# Patient Record
Sex: Male | Born: 1945 | Race: Black or African American | Hispanic: No | Smoking: Former smoker
Health system: Southern US, Community
[De-identification: ages and names within clinical notes are randomized; demographics above are authoritative.]

## PROBLEM LIST (undated history)

## (undated) DIAGNOSIS — R1310 Dysphagia, unspecified: Secondary | ICD-10-CM

## (undated) DIAGNOSIS — N289 Disorder of kidney and ureter, unspecified: Secondary | ICD-10-CM

## (undated) DIAGNOSIS — G8194 Hemiplegia, unspecified affecting left nondominant side: Secondary | ICD-10-CM

## (undated) DIAGNOSIS — G8190 Hemiplegia, unspecified affecting unspecified side: Secondary | ICD-10-CM

## (undated) DIAGNOSIS — I10 Essential (primary) hypertension: Secondary | ICD-10-CM

## (undated) DIAGNOSIS — I639 Cerebral infarction, unspecified: Secondary | ICD-10-CM

## (undated) DIAGNOSIS — F4320 Adjustment disorder, unspecified: Secondary | ICD-10-CM

## (undated) DIAGNOSIS — R410 Disorientation, unspecified: Secondary | ICD-10-CM

## (undated) HISTORY — DX: Cerebral infarction, unspecified: I63.9

## (undated) HISTORY — PX: GANGLION CYST EXCISION: SHX1691

## (undated) HISTORY — DX: Essential (primary) hypertension: I10

---

## 2004-07-14 ENCOUNTER — Ambulatory Visit: Payer: Self-pay | Admitting: Family Medicine

## 2004-07-29 ENCOUNTER — Ambulatory Visit: Payer: Self-pay | Admitting: Family Medicine

## 2004-08-01 ENCOUNTER — Ambulatory Visit: Payer: Self-pay | Admitting: Sports Medicine

## 2004-09-03 ENCOUNTER — Ambulatory Visit: Payer: Self-pay | Admitting: Family Medicine

## 2004-09-16 ENCOUNTER — Encounter: Admission: RE | Admit: 2004-09-16 | Discharge: 2004-09-16 | Payer: Self-pay | Admitting: Sports Medicine

## 2004-09-17 ENCOUNTER — Ambulatory Visit: Payer: Self-pay | Admitting: Family Medicine

## 2004-10-08 ENCOUNTER — Ambulatory Visit: Payer: Self-pay | Admitting: Sports Medicine

## 2004-11-12 ENCOUNTER — Ambulatory Visit: Payer: Self-pay | Admitting: Family Medicine

## 2004-12-11 ENCOUNTER — Ambulatory Visit: Payer: Self-pay | Admitting: Family Medicine

## 2004-12-19 ENCOUNTER — Ambulatory Visit: Payer: Self-pay | Admitting: Family Medicine

## 2004-12-26 ENCOUNTER — Ambulatory Visit: Payer: Self-pay | Admitting: Family Medicine

## 2005-01-12 ENCOUNTER — Ambulatory Visit: Payer: Self-pay | Admitting: Family Medicine

## 2005-01-19 ENCOUNTER — Ambulatory Visit: Payer: Self-pay | Admitting: Family Medicine

## 2005-02-11 ENCOUNTER — Ambulatory Visit: Payer: Self-pay | Admitting: Family Medicine

## 2005-02-26 ENCOUNTER — Ambulatory Visit: Payer: Self-pay | Admitting: Family Medicine

## 2005-03-10 ENCOUNTER — Ambulatory Visit: Payer: Self-pay | Admitting: Family Medicine

## 2005-03-31 ENCOUNTER — Ambulatory Visit: Payer: Self-pay | Admitting: Family Medicine

## 2005-05-05 ENCOUNTER — Ambulatory Visit: Payer: Self-pay | Admitting: Family Medicine

## 2005-06-17 ENCOUNTER — Ambulatory Visit: Payer: Self-pay | Admitting: Family Medicine

## 2005-08-24 ENCOUNTER — Ambulatory Visit: Payer: Self-pay | Admitting: Family Medicine

## 2005-08-28 ENCOUNTER — Ambulatory Visit: Payer: Self-pay | Admitting: Family Medicine

## 2006-12-09 DIAGNOSIS — F5232 Male orgasmic disorder: Secondary | ICD-10-CM

## 2006-12-09 DIAGNOSIS — E7800 Pure hypercholesterolemia, unspecified: Secondary | ICD-10-CM | POA: Insufficient documentation

## 2007-04-18 DIAGNOSIS — I69959 Hemiplegia and hemiparesis following unspecified cerebrovascular disease affecting unspecified side: Secondary | ICD-10-CM

## 2007-04-20 ENCOUNTER — Inpatient Hospital Stay (HOSPITAL_COMMUNITY): Admission: EM | Admit: 2007-04-20 | Discharge: 2007-04-26 | Payer: Self-pay | Admitting: Emergency Medicine

## 2007-04-21 ENCOUNTER — Encounter (INDEPENDENT_AMBULATORY_CARE_PROVIDER_SITE_OTHER): Payer: Self-pay | Admitting: Neurology

## 2007-04-21 ENCOUNTER — Ambulatory Visit: Payer: Self-pay | Admitting: Vascular Surgery

## 2007-04-22 ENCOUNTER — Ambulatory Visit: Payer: Self-pay | Admitting: Physical Medicine & Rehabilitation

## 2007-04-26 ENCOUNTER — Inpatient Hospital Stay (HOSPITAL_COMMUNITY)
Admission: RE | Admit: 2007-04-26 | Discharge: 2007-05-25 | Payer: Self-pay | Admitting: Physical Medicine & Rehabilitation

## 2007-06-22 ENCOUNTER — Encounter
Admission: RE | Admit: 2007-06-22 | Discharge: 2007-06-23 | Payer: Self-pay | Admitting: Physical Medicine & Rehabilitation

## 2007-06-22 ENCOUNTER — Ambulatory Visit: Payer: Self-pay | Admitting: Physical Medicine & Rehabilitation

## 2007-12-13 ENCOUNTER — Encounter
Admission: RE | Admit: 2007-12-13 | Discharge: 2007-12-14 | Payer: Self-pay | Admitting: Physical Medicine & Rehabilitation

## 2007-12-13 ENCOUNTER — Ambulatory Visit: Payer: Self-pay | Admitting: Physical Medicine & Rehabilitation

## 2008-04-24 ENCOUNTER — Encounter: Admission: RE | Admit: 2008-04-24 | Discharge: 2008-05-15 | Payer: Self-pay | Admitting: Internal Medicine

## 2008-07-10 ENCOUNTER — Emergency Department (HOSPITAL_COMMUNITY): Admission: EM | Admit: 2008-07-10 | Discharge: 2008-07-10 | Payer: Self-pay | Admitting: Emergency Medicine

## 2009-07-12 ENCOUNTER — Ambulatory Visit: Payer: Self-pay | Admitting: Vascular Surgery

## 2009-07-12 ENCOUNTER — Encounter: Payer: Self-pay | Admitting: Infectious Diseases

## 2009-07-12 ENCOUNTER — Ambulatory Visit: Payer: Self-pay | Admitting: Internal Medicine

## 2009-07-12 ENCOUNTER — Ambulatory Visit: Payer: Self-pay | Admitting: Infectious Diseases

## 2009-07-12 ENCOUNTER — Inpatient Hospital Stay (HOSPITAL_COMMUNITY): Admission: EM | Admit: 2009-07-12 | Discharge: 2009-07-13 | Payer: Self-pay | Admitting: Emergency Medicine

## 2009-07-12 LAB — CONVERTED CEMR LAB: Hgb A1c MFr Bld: 6.5 %

## 2009-07-13 ENCOUNTER — Encounter: Payer: Self-pay | Admitting: Internal Medicine

## 2009-08-08 ENCOUNTER — Encounter: Payer: Self-pay | Admitting: Internal Medicine

## 2009-08-08 ENCOUNTER — Ambulatory Visit: Payer: Self-pay | Admitting: Internal Medicine

## 2009-08-15 LAB — CONVERTED CEMR LAB
BUN: 17 mg/dL (ref 6–23)
Chloride: 107 meq/L (ref 96–112)
Creatinine, Ser: 0.9 mg/dL (ref 0.40–1.50)
Glucose, Bld: 137 mg/dL — ABNORMAL HIGH (ref 70–99)

## 2009-08-22 ENCOUNTER — Encounter: Payer: Self-pay | Admitting: Internal Medicine

## 2009-09-10 ENCOUNTER — Encounter: Admission: RE | Admit: 2009-09-10 | Discharge: 2009-10-11 | Payer: Self-pay | Admitting: Internal Medicine

## 2009-09-11 ENCOUNTER — Ambulatory Visit: Payer: Self-pay | Admitting: Internal Medicine

## 2009-09-11 DIAGNOSIS — I10 Essential (primary) hypertension: Secondary | ICD-10-CM | POA: Insufficient documentation

## 2009-09-11 DIAGNOSIS — E119 Type 2 diabetes mellitus without complications: Secondary | ICD-10-CM

## 2009-09-11 LAB — CONVERTED CEMR LAB

## 2009-10-01 ENCOUNTER — Ambulatory Visit: Payer: Self-pay | Admitting: Internal Medicine

## 2009-10-01 LAB — CONVERTED CEMR LAB
AST: 16 units/L (ref 0–37)
Bilirubin, Direct: 0.1 mg/dL (ref 0.0–0.3)
LDL Cholesterol: 65 mg/dL (ref 0–99)
Total CHOL/HDL Ratio: 3.3
Total Protein: 7.4 g/dL (ref 6.0–8.3)
Triglycerides: 93 mg/dL (ref <150)

## 2009-10-13 ENCOUNTER — Encounter: Payer: Self-pay | Admitting: Internal Medicine

## 2009-10-15 ENCOUNTER — Encounter: Admission: RE | Admit: 2009-10-15 | Discharge: 2009-11-22 | Payer: Self-pay | Admitting: Internal Medicine

## 2009-10-29 ENCOUNTER — Ambulatory Visit: Payer: Self-pay | Admitting: Internal Medicine

## 2009-11-06 ENCOUNTER — Encounter: Payer: Self-pay | Admitting: Internal Medicine

## 2009-11-22 ENCOUNTER — Encounter: Payer: Self-pay | Admitting: Internal Medicine

## 2009-12-10 ENCOUNTER — Ambulatory Visit: Payer: Self-pay | Admitting: Internal Medicine

## 2009-12-10 LAB — CONVERTED CEMR LAB
Blood Glucose, Fingerstick: 121
Calcium: 9.8 mg/dL (ref 8.4–10.5)
Chloride: 104 meq/L (ref 96–112)
Glucose, Bld: 128 mg/dL — ABNORMAL HIGH (ref 70–99)
Potassium: 4.1 meq/L (ref 3.5–5.3)
Total Bilirubin: 0.3 mg/dL (ref 0.3–1.2)

## 2009-12-18 ENCOUNTER — Encounter: Payer: Self-pay | Admitting: Internal Medicine

## 2010-03-24 ENCOUNTER — Ambulatory Visit: Payer: Self-pay | Admitting: Internal Medicine

## 2010-03-24 DIAGNOSIS — K5730 Diverticulosis of large intestine without perforation or abscess without bleeding: Secondary | ICD-10-CM | POA: Insufficient documentation

## 2010-03-24 LAB — CONVERTED CEMR LAB
Creatinine, Ser: 0.77 mg/dL (ref 0.40–1.50)
Glucose, Bld: 95 mg/dL (ref 70–99)
Hgb A1c MFr Bld: 6.2 %

## 2010-06-13 ENCOUNTER — Telehealth: Payer: Self-pay | Admitting: Internal Medicine

## 2010-09-22 ENCOUNTER — Ambulatory Visit: Payer: Self-pay | Admitting: Internal Medicine

## 2010-09-22 DIAGNOSIS — F17200 Nicotine dependence, unspecified, uncomplicated: Secondary | ICD-10-CM

## 2010-09-22 LAB — CONVERTED CEMR LAB
AST: 16 units/L (ref 0–37)
Alkaline Phosphatase: 87 units/L (ref 39–117)
BUN: 17 mg/dL (ref 6–23)
Blood Glucose, Fingerstick: 99
CO2: 24 meq/L (ref 19–32)
Chloride: 101 meq/L (ref 96–112)
Creatinine, Ser: 0.63 mg/dL (ref 0.40–1.50)
Glucose, Bld: 98 mg/dL (ref 70–99)
HDL: 34 mg/dL — ABNORMAL LOW (ref >39)
Microalb, Ur: 1.45 mg/dL (ref 0.00–1.89)
Potassium: 4 meq/L (ref 3.5–5.3)
Total Bilirubin: 0.2 mg/dL — ABNORMAL LOW (ref 0.3–1.2)
Total Protein: 7.1 g/dL (ref 6.0–8.3)
Triglycerides: 113 mg/dL (ref <150)

## 2010-09-23 ENCOUNTER — Telehealth: Payer: Self-pay | Admitting: Internal Medicine

## 2010-09-25 ENCOUNTER — Telehealth (INDEPENDENT_AMBULATORY_CARE_PROVIDER_SITE_OTHER): Payer: Self-pay | Admitting: *Deleted

## 2010-11-12 NOTE — Progress Notes (Signed)
Summary: Refill/gh  Phone Note Refill Request Message from:  Fax from Pharmacy on June 13, 2010 2:36 PM  Refills Requested: Medication #1:  PRAVACHOL 40 MG TABS Take 1 tablet by mouth once a day   Dosage confirmed as above?Dosage Confirmed   Brand Name Necessary? No   Supply Requested: 1 month   Last Refilled: 05/12/2010  Method Requested: Electronic Initial call taken by: Angelina Ok RN,  June 13, 2010 2:37 PM  Follow-up for Phone Call        Refill approved-nurse to complete Follow-up by: Lars Mage MD,  June 14, 2010 11:43 AM    Prescriptions: PRAVACHOL 40 MG TABS (PRAVASTATIN SODIUM) Take 1 tablet by mouth once a day  #30 x 11   Entered and Authorized by:   Lars Mage MD   Signed by:   Lars Mage MD on 06/14/2010   Method used:   Electronically to        [facility] (retail)       [address]       [place], [place]  [address]       Ph: [phone]       Fax: [phone]   RxID:   [id number]

## 2010-11-12 NOTE — Assessment & Plan Note (Signed)
Summary: F/U APPT/CFB   Vital Signs:  Patient profile:   65 year old male Height:      72 inches (182.88 cm) Weight:      207.3 pounds (94.23 kg) BMI:     28.22 Temp:     97.3 degrees F (36.28 degrees C) oral Pulse rate:   90 / minute BP sitting:   144 / 76  (right arm)  Vitals Entered By: Stanton Kidney Ditzler RN (December 10, 2009 9:21 AM) Is Patient Diabetic? Yes Did you bring your meter with you today? No Pain Assessment Patient in pain? no      Nutritional Status BMI of 25 - 29 = overweight Nutritional Status Detail appetite good CBG Result 121  Have you ever been in a relationship where you felt threatened, hurt or afraid?denies   Does patient need assistance? Functional Status Self care Ambulation Impaired:Risk for fall, Wheelchair Comments Uses a cane. FU - doing well. Colonscopy sch next week.   Primary Care Provider:  Lars Mage MD   History of Present Illness: Pt is a 65 year old man with pmh of Ischemic stroke in 2008 and HTN comes in today for a follow up visit. He was admitted on Oct 1st for an increasing left sided weakness but the workup done for any new stroke was negative.  He does not have any new complaints today. He is getting his PT regularly and has been complaint with all his meds. He does not want to quit smoking at this time.  Depression History:      The patient denies a depressed mood most of the day and a diminished interest in his usual daily activities.         Preventive Screening-Counseling & Management  Alcohol-Tobacco     Alcohol drinks/day: 0     Smoking Status: current     Smoking Cessation Counseling: yes     Smoke Cessation Stage: precontemplative     Packs/Day: 2 cigs per day     Pack years: 35  Caffeine-Diet-Exercise     Does Patient Exercise: no  Problems Prior to Update: 1)  Preventive Health Care  (ICD-V70.0) 2)  Hypertension  (ICD-401.9) 3)  Diabetes Mellitus, Type II  (ICD-250.00) 4)  Cva With Left Hemiparesis   (ICD-438.20) 5)  Hypercholesterolemia  (ICD-272.0) 6)  Erectile Dysfunction  (ICD-302.74) 7)  Diabetes Mellitus II, Uncomplicated  (ICD-250.00)  Medications Prior to Update: 1)  Metformin Hcl 500 Mg Tabs (Metformin Hcl) .... Take 1 Tablet By Mouth Two Times A Day 2)  Lisinopril-Hydrochlorothiazide 20-25 Mg Tabs (Lisinopril-Hydrochlorothiazide) .... Take 1 Tablet By Mouth Once A Day 3)  Cvs Omeprazole 20 Mg Tbec (Omeprazole) .... Take 1 Tablet By Mouth Once A Day 4)  Pravachol 40 Mg Tabs (Pravastatin Sodium) .... Take 1 Tablet By Mouth Once A Day 5)  Aggrenox 25-200 Mg Xr12h-Cap (Aspirin-Dipyridamole) .... Take 1 Capsule By Mouth Two Times A Day  Current Medications (verified): 1)  Metformin Hcl 500 Mg Tabs (Metformin Hcl) .... Take 1 Tablet By Mouth Two Times A Day 2)  Lisinopril-Hydrochlorothiazide 20-25 Mg Tabs (Lisinopril-Hydrochlorothiazide) .... Take 1 Tablet By Mouth Once A Day 3)  Cvs Omeprazole 20 Mg Tbec (Omeprazole) .... Take 1 Tablet By Mouth Once A Day 4)  Pravachol 40 Mg Tabs (Pravastatin Sodium) .... Take 1 Tablet By Mouth Once A Day 5)  Aggrenox 25-200 Mg Xr12h-Cap (Aspirin-Dipyridamole) .... Take 1 Capsule By Mouth Two Times A Day  Allergies (verified): No Known Drug Allergies  Past History:  Past Medical History: Last updated: 09/11/2009 Diabetes mellitus, type II Cerebrovascular accident, hx of Hypertension  Past Surgical History: Last updated: 12-14-06 S/P ganglion cyst removal-wrist in 1965 - 07/12/2004  Family History: Last updated: 2006-12-14 Father died at 26 with DM., Mother alive at 63 with Breast Cancer.  Social History: Last updated: 08/08/2009 Evacuee from [place].  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  quit 2 years ago. Does not drink Etoh.  Eats a fairly healthy diet. Not working now and has applied for disablility.  Risk Factors: Alcohol Use: 0 (12/10/2009) Exercise: no (12/10/2009)  Risk Factors: Smoking Status:  current (12/10/2009) Packs/Day: 2 cigs per day (12/10/2009)  Social History: Packs/Day:  2 cigs per day  Review of Systems      See HPI  Physical Exam  Additional Exam:  Gen: AOx3, in no acute distress Eyes: PERRL, EOMI ENT:MMM, No erythema noted in posterior pharynx Neck: No JVD, No LAP Chest: CTAB with  good respiratory effort CVS: regular rhythmic rate, NO M/R/G, S1 S2 normal Abdo: soft,ND, BS+x4, Non tender and No hepatosplenomegaly EXT: No odema noted Neuro: left hemiparesis Skin: no rashes noted.    Impression & Recommendations:  Problem # 1:  HYPERTENSION (ICD-401.9) His blood pressure is doing much better today as compared to last visist but is still slightly upp from goal. With continued PT and not much benefit by reducing it from 144 to 140 in terms of 10 year risk reduction, I will not start him on another med at this time. His updated medication list for this problem includes:    Lisinopril-hydrochlorothiazide 20-25 Mg Tabs (Lisinopril-hydrochlorothiazide) .Marland Kitchen... Take 1 tablet by mouth once a day  Orders: T-Comprehensive Metabolic Panel ([id number])  BP today: 144/76 Prior BP: 189/90 (10/29/2009)  Labs Reviewed: K+: 4.1 (08/08/2009) Creat: : 0.90 (08/08/2009)   Chol: 121 (10/01/2009)   HDL: 37 (10/01/2009)   LDL: 65 (10/01/2009)   TG: 93 (10/01/2009)  Problem # 2:  PREVENTIVE HEALTH CARE (ICD-V70.0) Colonoscopy next week. Rest of the vaccines are uptodate. Reviewed preventive care protocols, scheduled due services, and updated immunizations.  Problem # 3:  DIABETES MELLITUS, TYPE II (ICD-250.00) well controlled with latest HBa1c of 6.1. He is unable to give his urine sample for Urine microalbumin ratio. His updated medication list for this problem includes:    Metformin Hcl 500 Mg Tabs (Metformin hcl) .Marland Kitchen... Take 1 tablet by mouth two times a day    Lisinopril-hydrochlorothiazide 20-25 Mg Tabs (Lisinopril-hydrochlorothiazide) .Marland Kitchen... Take 1 tablet by mouth  once a day  Orders: Capillary Blood Glucose/CBG (21308) T-Comprehensive Metabolic Panel ([id number])  Problem # 4:  CVA WITH LEFT HEMIPARESIS (ICD-438.20) He recently completed his PT and has promises to continue working on his arm as suggested by PT. His updated medication list for this problem includes:    Aggrenox 25-200 Mg Xr12h-cap (Aspirin-dipyridamole) .Marland Kitchen... Take 1 capsule by mouth two times a day  Problem # 5:  HYPERCHOLESTEROLEMIA (ICD-272.0) Will obatin CMP for hepatic function in view of chronic use of statins. His updated medication list for this problem includes:    Pravachol 40 Mg Tabs (Pravastatin sodium) .Marland Kitchen... Take 1 tablet by mouth once a day  Orders: T-Comprehensive Metabolic Panel ([id number])  Complete Medication List: 1)  Metformin Hcl 500 Mg Tabs (Metformin hcl) .... Take 1 tablet by mouth two times a day 2)  Lisinopril-hydrochlorothiazide 20-25 Mg Tabs (Lisinopril-hydrochlorothiazide) .... Take 1 tablet by mouth once a day 3)  Cvs Omeprazole 20  Mg Tbec (Omeprazole) .... Take 1 tablet by mouth once a day 4)  Pravachol 40 Mg Tabs (Pravastatin sodium) .... Take 1 tablet by mouth once a day 5)  Aggrenox 25-200 Mg Xr12h-cap (Aspirin-dipyridamole) .... Take 1 capsule by mouth two times a day  Patient Instructions: 1)  Please schedule a follow-up appointment in 6 months. 2)  Please schedule a follow-up appointment as needed. 3)  Limit your Sodium (Salt). 4)  Limit your Sodium (Salt) to less than 2 grams a day(slightly less than 1/2 a teaspoon) to prevent fluid retention, swelling, or worsening of symptoms. 5)  Tobacco is very bad for your health and your loved ones! You Should stop smoking!. 6)  Stop Smoking Tips: Choose a Quit date. Cut down before the Quit date. decide what you will do as a substitute when you feel the urge to smoke(gum,toothpick,exercise). 7)  It is important that you exercise regularly at least 20 minutes 5 times a week. If you develop chest  pain, have severe difficulty breathing, or feel very tired , stop exercising immediately and seek medical attention. 8)  You need to lose weight. Consider a lower calorie diet and regular exercise.  9)  Check your blood sugars regularly. If your readings are usually above : 200 or below 70 you should contact our office. 10)  It is important that your Diabetic A1c level is checked every 3 months. 11)  See your eye doctor yearly to check for diabetic eye damage. 12)  Check your feet each night for sore areas, calluses or signs of infection. 13)  Check your Blood Pressure regularly. If it is above:130/80 you should make an appointment. 14)  It is important to use your inhaler properly. Use a spacer, take slow deep breaths and hold them. Rinse your mouth after using.  Prescriptions: METFORMIN HCL 500 MG TABS (METFORMIN HCL) Take 1 tablet by mouth two times a day  #60 x 11   Entered and Authorized by:   Lars Mage MD   Signed by:   Lars Mage MD on 12/10/2009   Method used:   Electronically to        [facility] (retail)       [address]       [place], [place]  [address]       Ph: [phone]       Fax: [phone]   RxID:   [id number]  Process Orders Check Orders Results:     [facility]: Check successful Tests Sent for requisitioning (December 10, 2009 1:55 PM):     12/10/2009: [facility] -- T-Comprehensive Metabolic Panel [phone] (signed)    Prevention & Chronic Care Immunizations   Influenza vaccine: Not documented   Influenza vaccine deferral: Not indicated  (09/11/2009)    Tetanus booster: 10/29/2009: Td   Td booster deferral: Refused  (09/11/2009)    Pneumococcal vaccine: Pneumovax (Medicare)  (10/29/2009)    H. zoster vaccine: Not documented   H. zoster vaccine deferral: Deferred  (12/10/2009)  Colorectal Screening   Hemoccult: Done.  (11/12/2004)   Hemoccult action/deferral: Not indicated   (12/10/2009)    Colonoscopy: Not documented   Colonoscopy action/deferral: Deferred  (12/10/2009)  Other Screening   PSA: Not documented   PSA action/deferral: Discussed-PSA declined  (09/11/2009)   Smoking status: current  (12/10/2009)   Smoking cessation counseling: yes  (12/10/2009)  Diabetes Mellitus   HgbA1C: 6.1  (10/29/2009)  HgbA1C action/deferral: Ordered  (10/29/2009)    Eye exam: Not documented   Diabetic eye exam action/deferral: Ophthalmology referral  (08/08/2009)    Foot exam: Not documented   Foot exam action/deferral: Deferred   High risk foot: Not documented   Foot care education: Not documented    Urine microalbumin/creatinine ratio: Not documented   Urine microalbumin action/deferral: Deferred    Diabetes flowsheet reviewed?: Yes   Progress toward A1C goal: At goal  Lipids   Total Cholesterol: 121  (10/01/2009)   Lipid panel action/deferral: Deferred   LDL: 65  (10/01/2009)   LDL Direct: Not documented   HDL: 37  (10/01/2009)   Triglycerides: 93  (10/01/2009)    SGOT (AST): 16  (10/01/2009)   BMP action: Deferred   SGPT (ALT): 13  (10/01/2009) CMP ordered    Alkaline phosphatase: 83  (10/01/2009)   Total bilirubin: 0.4  (10/01/2009)    Lipid flowsheet reviewed?: Yes   Progress toward LDL goal: At goal  Hypertension   Last Blood Pressure: 144 / 76  (12/10/2009)   Serum creatinine: 0.90  (08/08/2009)   BMP action: Ordered   Serum potassium 4.1  (08/08/2009) CMP ordered     Hypertension flowsheet reviewed?: Yes   Progress toward BP goal: At goal  Self-Management Support :   Personal Goals (by the next clinic visit) :     Personal A1C goal: 7  (09/11/2009)     Personal blood pressure goal: 140/90  (09/11/2009)     Personal LDL goal: 70  (09/11/2009)    Patient will work on the following items until the next clinic visit to reach self-care goals:     Medications and monitoring: take my medicines every day, examine my feet every day   (12/10/2009)     Eating: drink diet soda or water instead of juice or soda, eat more vegetables, use fresh or frozen vegetables, eat foods that are low in salt, eat fruit for snacks and desserts, limit or avoid alcohol  (12/10/2009)     Activity: take a 30 minute walk every day  (12/10/2009)    Diabetes self-management support: Education handout, Written self-care plan, Resources for patients handout  (12/10/2009)   Diabetes care plan printed   Diabetes education handout printed   Last diabetes self-management training by diabetes educator: 09/11/2009    Hypertension self-management support: Written self-care plan, Resources for patients handout, Education handout  (12/10/2009)   Hypertension self-care plan printed.   Hypertension education handout printed    Lipid self-management support: Written self-care plan, Education handout, Resources for patients handout  (12/10/2009)   Lipid self-care plan printed.   Lipid education handout printed      Resource handout printed.

## 2010-11-12 NOTE — Procedures (Signed)
[facility]  [facility]   Imported By: Florinda Marker 01/01/2010 16:04:12  _____________________________________________________________________  External Attachment:    Type:   Image     Comment:   External Document

## 2010-11-12 NOTE — Miscellaneous (Signed)
Summary: Discharge Summary For PT  Discharge Summary For PT   Imported By: Florinda Marker 12/05/2009 13:53:04  _____________________________________________________________________  External Attachment:    Type:   Image     Comment:   External Document

## 2010-11-12 NOTE — Miscellaneous (Signed)
Summary: Evaluation & Treatment  Evaluation & Treatment   Imported By: Florinda Marker 10/14/2009 15:38:23  _____________________________________________________________________  External Attachment:    Type:   Image     Comment:   External Document

## 2010-11-12 NOTE — Assessment & Plan Note (Signed)
Summary: EST-F/U/MEDS/CFB   Vital Signs:  Patient profile:   65 year old male Height:      72 inches (182.88 cm) Weight:      206.5 pounds (93.86 kg) BMI:     28.11 Temp:     99.0 degrees F (37.22 degrees C) oral Pulse rate:   83 / minute BP sitting:   131 / 75  (right arm) Cuff size:   large  Vitals Entered By: Cynda Familia Duncan Dull) (March 24, 2010 1:49 PM) CC: routine f/u, needs extra refills on all meds Is Patient Diabetic? Yes Did you bring your meter with you today? No-dont have one Pain Assessment Patient in pain? no      Nutritional Status BMI of > 30 = obese  Have you ever been in a relationship where you felt threatened, hurt or afraid?No   Does patient need assistance? Functional Status Cook/clean Ambulation Impaired:Risk for fall, Wheelchair Comments arrived via wheelchair and cane   Primary Care Provider:  Lars Mage MD  CC:  routine f/u and needs extra refills on all meds.  History of Present Illness: Patient is a 65 year old man with PMH as described in EMRwellknown to [facility] here today for a follow up appointment. Patient's HBa1c is <7. Opthalmology exam due. Urine crt/albumin to be done today though he is already on ACEi's. HTN is under control with 135/70 in clinic today. Patient had colonoscopy which was s/o diverticulosis. I xplained the results and asked him to take care regular BM and danger signs.  Still smoking 1/2 PPD and is not reday to quit at this time.  He is moving to [place] his hometown and wants medication refills.    Preventive Screening-Counseling & Management  Alcohol-Tobacco     Alcohol drinks/day: 0     Smoking Status: current     Smoking Cessation Counseling: yes     Smoke Cessation Stage: precontemplative     Packs/Day: 0.5     Pack years: 35  Colonoscopy  Procedure date:  03/24/2010  Findings:      Patient was diagnosed with colonic diverticulosis and need tofollow up with fecaloccult blood cards as out  patient 3 times in next 4 years as per GI.  Problems Prior to Update: 1)  Preventive Health Care  (ICD-V70.0) 2)  Hypertension  (ICD-401.9) 3)  Diabetes Mellitus, Type II  (ICD-250.00) 4)  Cva With Left Hemiparesis  (ICD-438.20) 5)  Hypercholesterolemia  (ICD-272.0) 6)  Erectile Dysfunction  (ICD-302.74) 7)  Diabetes Mellitus II, Uncomplicated  (ICD-250.00)  Medications Prior to Update: 1)  Metformin Hcl 500 Mg Tabs (Metformin Hcl) .... Take 1 Tablet By Mouth Two Times A Day 2)  Lisinopril-Hydrochlorothiazide 20-25 Mg Tabs (Lisinopril-Hydrochlorothiazide) .... Take 1 Tablet By Mouth Once A Day 3)  [facility] Omeprazole 20 Mg Tbec (Omeprazole) .... Take 1 Tablet By Mouth Once A Day 4)  Pravachol 40 Mg Tabs (Pravastatin Sodium) .... Take 1 Tablet By Mouth Once A Day 5)  Aggrenox 25-200 Mg Xr12h-Cap (Aspirin-Dipyridamole) .... Take 1 Capsule By Mouth Two Times A Day  Current Medications (verified): 1)  Metformin Hcl 500 Mg Tabs (Metformin Hcl) .... Take 1 Tablet By Mouth Two Times A Day 2)  Lisinopril-Hydrochlorothiazide 20-25 Mg Tabs (Lisinopril-Hydrochlorothiazide) .... Take 1 Tablet By Mouth Once A Day 3)  [facility]vs Omeprazole 20 Mg Tbec (Omeprazole) .... Take 1 Tablet By Mouth Once A Day 4)  Pravachol 40 Mg Tabs (Pravastatin Sodium) .... Take 1 Tablet By Mouth Once A Day 5)  Aggrenox 25-200 Mg Xr12h-Cap (Aspirin-Dipyridamole) .... Take 1 Capsule By Mouth Two Times A Day  Allergies (verified): No Known Drug Allergies  Past History:  Past Medical History: Last updated: 09/11/2009 Diabetes mellitus, type II Cerebrovascular accident, hx of Hypertension  Past Surgical History: Last updated: 2007/01/08 S/P ganglion cyst removal-wrist in 1965 - 07/12/2004  Family History: Last updated: 2007-01-08 Father died at 50 with DM., Mother alive at 59 with Breast Cancer.  Social History: Last updated: 08/08/2009 Evacuee from [place].  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  quit 2  years ago. Does not drink Etoh.  Eats a fairly healthy diet. Not working now and has applied for disablility.  Risk Factors: Alcohol Use: 0 (03/24/2010) Exercise: no (12/10/2009)  Risk Factors: Smoking Status: current (03/24/2010) Packs/Day: 0.5 (03/24/2010)  Family History: Reviewed history from 01-08-07 and no changes required. Father died at 10 with DM., Mother alive at 19 with Breast Cancer.  Social History: Packs/Day:  0.5  Review of Systems      See HPI  Physical Exam  Additional Exam:  Gen: AOx3, in no acute distress, sitting comfortably in wheel chair. Eyes: PERRL, EOMI ENT:MMM, No erythema noted in posterior pharynx Neck: No JVD, No LAP Chest: CTAB with  good respiratory effort CVS: regular rhythmic rate, NO M/R/G, S1 S2 normal Abdo: soft,ND, BS+x4, Non tender and No hepatosplenomegaly EXT: No odema noted Neuro: Non focal, unable to walk 2/2 left sided hemiparesis Skin: no rashes noted.    Impression & Recommendations:  Problem # 1:  DIVERTICULOSIS OF COLON (ICD-562.10) Assessment New Newly diagnosed on his colonoscopy. I counselled him about the importance of regular bowel movement. I also explained to him about the danger signs and when to seek medical care.  Problem # 2:  HYPERTENSION (ICD-401.9) Assessment: Improved Wellcontrolled on current meds. I will check Bmet for Crt and K as he is on ACEi. His updated medication list for this problem includes:    Lisinopril-hydrochlorothiazide 20-25 Mg Tabs (Lisinopril-hydrochlorothiazide) .Marland Kitchen... Take 1 tablet by mouth once a day  Orders: T-Basic Metabolic Panel [phone])  BP today: 131/75 Prior BP: 144/76 (12/10/2009)  Labs Reviewed: K+: 4.1 (12/10/2009) Creat: : 0.86 (12/10/2009)   Chol: 121 (10/01/2009)   HDL: 37 (10/01/2009)   LDL: 65 (10/01/2009)   TG: 93 (10/01/2009)  Problem # 3:  DIABETES MELLITUS, TYPE II (ICD-250.00) Assessment: Improved Continue on current meds. His Hba1c today is 6.2.  Optha referral today. Could not get urine microalbumin/crt. His updated medication list for this problem includes:    Metformin Hcl 500 Mg Tabs (Metformin hcl) .Marland Kitchen... Take 1 tablet by mouth two times a day    Lisinopril-hydrochlorothiazide 20-25 Mg Tabs (Lisinopril-hydrochlorothiazide) .Marland Kitchen... Take 1 tablet by mouth once a day  Orders: T-Hgb A1C (in-house) ([id number]) Ophthalmology Referral (Ophthalmology)  Problem # 4:  PREVENTIVE HEALTH CARE (ICD-V70.0) Assessment: Comment Only  Reviewed preventive care protocols, scheduled due services, and updated immunizations.  Problem # 5:  HYPERCHOLESTEROLEMIA (ICD-272.0) Assessment: Comment Only Patient needsa  follow up Lipid profile in another 6 months as she has been wellcontrolled and complaint with his current dose of meds. His updated medication list for this problem includes:    Pravachol 40 Mg Tabs (Pravastatin sodium) .Marland Kitchen... Take 1 tablet by mouth once a day  Labs Reviewed: SGOT: 11 (12/10/2009)   SGPT: <8 U/L (12/10/2009)   HDL:37 (10/01/2009)  LDL:65 (10/01/2009)  Chol:121 (10/01/2009)  Trig:93 (10/01/2009)  Complete Medication List: 1)  Metformin Hcl 500 Mg  Tabs (Metformin hcl) .... Take 1 tablet by mouth two times a day 2)  Lisinopril-hydrochlorothiazide 20-25 Mg Tabs (Lisinopril-hydrochlorothiazide) .... Take 1 tablet by mouth once a day 3)  Cvs Omeprazole 20 Mg Tbec (Omeprazole) .... Take 1 tablet by mouth once a day 4)  Pravachol 40 Mg Tabs (Pravastatin sodium) .... Take 1 tablet by mouth once a day 5)  Aggrenox 25-200 Mg Xr12h-cap (Aspirin-dipyridamole) .... Take 1 capsule by mouth two times a day  Patient Instructions: 1)  Please schedule a follow-up appointment in 6 months. 2)  Tobacco is very bad for your health and your loved ones! You Should stop smoking!. 3)  Stop Smoking Tips: Choose a Quit date. Cut down before the Quit date. decide what you will do as a substitute when you feel the urge to  smoke(gum,toothpick,exercise). 4)  It is important that you exercise regularly at least 20 minutes 5 times a week. If you develop chest pain, have severe difficulty breathing, or feel very tired , stop exercising immediately and seek medical attention. 5)  It is important that your Diabetic A1c level is checked every 3 months. 6)  See your eye doctor yearly to check for diabetic eye damage. 7)  Check your feet each night for sore areas, calluses or signs of infection. 8)  Check your Blood Pressure regularly. If it is above: you should make an appointment. Prescriptions: AGGRENOX 25-200 MG XR12H-CAP (ASPIRIN-DIPYRIDAMOLE) Take 1 capsule by mouth two times a day  #60 x 11   Entered and Authorized by:   Lars Mage MD   Signed by:   Lars Mage MD on 03/24/2010   Method used:   Print then Give to Patient   RxID:   [id number] PRAVACHOL 40 MG TABS (PRAVASTATIN SODIUM) Take 1 tablet by mouth once a day  #30 x 11   Entered and Authorized by:   Lars Mage MD   Signed by:   Lars Mage MD on 03/24/2010   Method used:   Print then Give to Patient   RxID:   [id number] [facility] OMEPRAZOLE 20 MG TBEC (OMEPRAZOLE) Take 1 tablet by mouth once a day  #30 x 11   Entered and Authorized by:   Lars Mage MD   Signed by:   Lars Mage MD on 03/24/2010   Method used:   Print then Give to Patient   RxID:   [id number] LISINOPRIL-HYDROCHLOROTHIAZIDE 20-25 MG TABS (LISINOPRIL-HYDROCHLOROTHIAZIDE) Take 1 tablet by mouth once a day  #30 x 11   Entered and Authorized by:   Lars Mage MD   Signed by:   Lars Mage MD on 03/24/2010   Method used:   Print then Give to Patient   RxID:   [id number] METFORMIN HCL 500 MG TABS (METFORMIN HCL) Take 1 tablet by mouth two times a day  #60 x 11   Entered and Authorized by:   Lars Mage MD   Signed by:   Lars Mage MD on 03/24/2010   Method used:   Print then Give to Patient   RxID:   [id number]  Process Orders Check Orders Results:     [facility]: Check successful Tests Sent for requisitioning (March 24, 2010 4:07 PM):     03/24/2010: [facility] -- T-Basic Metabolic Panel [phone] (signed)    Prevention & Chronic Care Immunizations   Influenza vaccine: Not documented   Influenza vaccine deferral: Not indicated  (09/11/2009)    Tetanus booster: 10/29/2009: Td   Td booster deferral: Refused  (  03/24/2010)    Pneumococcal vaccine: Pneumovax (Medicare)  (10/29/2009)    H. zoster vaccine: Not documented   H. zoster vaccine deferral: Deferred  (12/10/2009)  Colorectal Screening   Hemoccult: Done.  (11/12/2004)   Hemoccult action/deferral: Not indicated  (12/10/2009)    Colonoscopy: Patient was diagnosed with colonic diverticulosis and need tofollow up with fecaloccult blood cards as out patient 3 times in next 4 years as per GI.  (03/24/2010)   Colonoscopy action/deferral: Deferred  (12/10/2009)  Other Screening   PSA: Not documented   PSA action/deferral: Discussed-PSA declined  (09/11/2009)   Smoking status: current  (03/24/2010)   Smoking cessation counseling: yes  (03/24/2010)  Diabetes Mellitus   HgbA1C: 6.2  (03/24/2010)   HgbA1C action/deferral: Ordered  (10/29/2009)    Eye exam: Not documented   Diabetic eye exam action/deferral: Ophthalmology referral  (03/24/2010)    Foot exam: Not documented   Foot exam action/deferral: Deferred   High risk foot: Not documented   Foot care education: Not documented    Urine microalbumin/creatinine ratio: Not documented   Urine microalbumin action/deferral: Ordered    Diabetes flowsheet reviewed?: Yes   Progress toward A1C goal: At goal  Lipids   Total Cholesterol: 121  (10/01/2009)   Lipid panel action/deferral: Deferred   LDL: 65  (10/01/2009)   LDL Direct: Not documented   HDL: 37  (10/01/2009)   Triglycerides: 93  (10/01/2009)    SGOT (AST): 11  (12/10/2009)   BMP action: Deferred   SGPT (ALT): <8 U/L  (12/10/2009)    Alkaline phosphatase: 97  (12/10/2009)   Total bilirubin: 0.3  (12/10/2009)    Lipid flowsheet reviewed?: Yes   Progress toward LDL goal: At goal  Hypertension   Last Blood Pressure: 131 / 75  (03/24/2010)   Serum creatinine: 0.86  (12/10/2009)   BMP action: Ordered   Serum potassium 4.1  (12/10/2009)    Hypertension flowsheet reviewed?: Yes   Progress toward BP goal: At goal  Self-Management Support :   Personal Goals (by the next clinic visit) :     Personal A1C goal: 7  (09/11/2009)     Personal blood pressure goal: 140/90  (09/11/2009)     Personal LDL goal: 70  (09/11/2009)    Patient will work on the following items until the next clinic visit to reach self-care goals:     Medications and monitoring: take my medicines every day  (03/24/2010)     Eating: drink diet soda or water instead of juice or soda, eat more vegetables, use fresh or frozen vegetables, eat foods that are low in salt, eat fruit for snacks and desserts, limit or avoid alcohol  (12/10/2009)     Activity: take a 30 minute walk every day  (12/10/2009)    Diabetes self-management support: Education handout, Psychologist, forensic, Written self-care plan  (03/24/2010)   Diabetes care plan printed   Diabetes education handout printed   Last diabetes self-management training by diabetes educator: 09/11/2009    Hypertension self-management support: Education handout, Psychologist, forensic, Written self-care plan  (03/24/2010)   Hypertension self-care plan printed.   Hypertension education handout printed    Lipid self-management support: Written self-care plan  (03/24/2010)   Lipid self-care plan printed.   Nursing Instructions: Refer for screening diabetic eye exam (see order)    Laboratory Results   Blood Tests   Date/Time Received: March 24, 2010 2:18 PM Date/Time Reported: Alric Quan  March 24, 2010 2:18 PM   HGBA1C: 6.2%   (  Normal Range: Non-Diabetic - 3-6%   Control  Diabetic - 6-8%)     Appended Document: EST-F/U/MEDS/CFB refill received for omeprazole, pt called and he did not have Rx.  Will call in for him.

## 2010-11-12 NOTE — Assessment & Plan Note (Signed)
Summary: F/U/VS   Vital Signs:  Patient profile:   65 year old male Height:      72 inches Weight:      214.6 pounds BMI:     29.21 Temp:     97.0 degrees F oral Pulse rate:   71 / minute BP sitting:   189 / 90  (right arm)  Vitals Entered By: Filomena Jungling NT II (October 29, 2009 1:49 PM) Is Patient Diabetic? Yes Did you bring your meter with you today? Yes Pain Assessment Patient in pain? no      Nutritional Status FOLLOWUP VISITBMI of > 30 = obese  Does patient need assistance? Functional Status Self care Ambulation Impaired:Risk for fall, Wheelchair   Primary Care Provider:  Lars Mage MD   History of Present Illness: Pt is a 65 year old man with pmh of Ischemic stroke in 2008 and HTN comes in today for a follow up visit. He was admitted on Oct 1st for an increasing left sided weakness but the workup done for any new stroke was negative.  He does not have any new complaints today. He is getting his PT regularly and has been complaint with all his meds. He ran out of his BP meds and promises to pick up the prescriptions today. He is still smoking 1/3 pack per day. His lab results were discussed with him and I encouraged him to continue his medical therapy.  Preventive Screening-Counseling & Management  Alcohol-Tobacco     Smoking Status: current     Smoking Cessation Counseling: yes     Smoke Cessation Stage: precontemplative     Packs/Day: 0.25     Pack years: 35  Problems Prior to Update: 1)  Hypertension  (ICD-401.9) 2)  Diabetes Mellitus, Type II  (ICD-250.00) 3)  Cva With Left Hemiparesis  (ICD-438.20) 4)  Hypercholesterolemia  (ICD-272.0) 5)  Erectile Dysfunction  (ICD-302.74) 6)  Diabetes Mellitus II, Uncomplicated  (ICD-250.00)  Medications Prior to Update: 1)  Metformin Hcl 500 Mg Tabs (Metformin Hcl) .... Take 1 Tablet By Mouth Two Times A Day 2)  Lisinopril-Hydrochlorothiazide 20-25 Mg Tabs (Lisinopril-Hydrochlorothiazide) .... Take 1 Tablet  By Mouth Once A Day 3)  [facility]vs Omeprazole 20 Mg Tbec (Omeprazole) .... Take 1 Tablet By Mouth Once A Day 4)  Pravachol 40 Mg Tabs (Pravastatin Sodium) .... Take 1 Tablet By Mouth Once A Day 5)  Aggrenox 25-200 Mg Xr12h-Cap (Aspirin-Dipyridamole) .... Take 1 Capsule By Mouth Two Times A Day  Current Medications (verified): 1)  Metformin Hcl 500 Mg Tabs (Metformin Hcl) .... Take 1 Tablet By Mouth Two Times A Day 2)  Lisinopril-Hydrochlorothiazide 20-25 Mg Tabs (Lisinopril-Hydrochlorothiazide) .... Take 1 Tablet By Mouth Once A Day 3)  [facility]vs Omeprazole 20 Mg Tbec (Omeprazole) .... Take 1 Tablet By Mouth Once A Day 4)  Pravachol 40 Mg Tabs (Pravastatin Sodium) .... Take 1 Tablet By Mouth Once A Day 5)  Aggrenox 25-200 Mg Xr12h-Cap (Aspirin-Dipyridamole) .... Take 1 Capsule By Mouth Two Times A Day  Allergies (verified): No Known Drug Allergies  Past History:  Past Medical History: Last updated: 09/11/2009 Diabetes mellitus, type II Cerebrovascular accident, hx of Hypertension  Past Surgical History: Last updated: Dec 15, 2006 S/P ganglion cyst removal-wrist in 1965 - 07/12/2004  Family History: Last updated: 12-15-2006 Father died at 23 with DM., Mother alive at 92 with Breast Cancer.  Social History: Last updated: 08/08/2009 Evacuee from [place].  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  quit  2 years ago. Does not drink Etoh.  Eats a fairly healthy diet. Not working now and has applied for disablility.  Risk Factors: Alcohol Use: 0 (09/11/2009) Exercise: no (09/11/2009)  Risk Factors: Smoking Status: current (10/29/2009) Packs/Day: 0.25 (10/29/2009)  Social History: Smoking Status:  current Packs/Day:  0.25  Review of Systems      See HPI  Physical Exam  Additional Exam:   General:  alert, well-developed, well-hydrated, normal appearance, and cooperative to examination.   Head:  normocephalic and atraumatic.  normocephalic and atraumatic.   Eyes:  vision  grossly intact, pupils equal, pupils round, and pupils reactive to light.  vision grossly intact, pupils equal, pupils round, and pupils reactive to light.   Ears:  no external deformities.  no external deformities.  no external deformities.   Lungs:  normal respiratory effort, normal breath sounds, no crackles, and no wheezes.  normal respiratory effort, normal breath sounds, no crackles, and no wheezes.   Heart:  Normal rate and regular rhythm. S1 and S2 normal without gallop, murmur, click, rub or other extra sounds. Abdomen:  Bowel sounds positive,abdomen soft and non-tender without masses, organomegaly or hernias noted. Msk:  5/5 on the right lower and RU limb. 3/5 in left LL and 2/5 in left UL. Pulses:  R and L carotid,radial,femoral,dorsalis pedis and posterior tibial pulses are full and equal bilaterally Neurologic:  alert & oriented X3 and cranial nerves II-XII intact.  See MSK findings.  Psych:  Oriented X3, memory intact for recent and remote, and normally interactive.  Oriented X3, memory intact for recent and remote, and normally interactive.     Impression & Recommendations:  Problem # 1:  HYPERTENSION (ICD-401.9)  He is out of his lisinopril prescriptions and promises to refill his meds today. Will check Cmet during next visit in march2011. Repeat BP was 164/88 His updated medication list for this problem includes:    Lisinopril-hydrochlorothiazide 20-25 Mg Tabs (Lisinopril-hydrochlorothiazide) .Marland Kitchen... Take 1 tablet by mouth once a day  BP today: 189/90 Prior BP: 141/75 (09/11/2009)  Labs Reviewed: K+: 4.1 (08/08/2009) Creat: : 0.90 (08/08/2009)   Chol: 121 (10/01/2009)   HDL: 37 (10/01/2009)   LDL: 65 (10/01/2009)   TG: 93 (10/01/2009)  His updated medication list for this problem includes:    Lisinopril-hydrochlorothiazide 20-25 Mg Tabs (Lisinopril-hydrochlorothiazide) .Marland Kitchen... Take 1 tablet by mouth once a day  Problem # 2:  DIABETES MELLITUS, TYPE II (ICD-250.00)  No new  changes required today. will review urine microalbumin and Hba1c results. optha referral pending. His updated medication list for this problem includes:    Metformin Hcl 500 Mg Tabs (Metformin hcl) .Marland Kitchen... Take 1 tablet by mouth two times a day    Lisinopril-hydrochlorothiazide 20-25 Mg Tabs (Lisinopril-hydrochlorothiazide) .Marland Kitchen... Take 1 tablet by mouth once a day  Labs Reviewed: Creat: 0.90 (08/08/2009)    Reviewed HgBA1c results: 6.5 in the hospital (07/12/2009)  His updated medication list for this problem includes:    Metformin Hcl 500 Mg Tabs (Metformin hcl) .Marland Kitchen... Take 1 tablet by mouth two times a day    Lisinopril-hydrochlorothiazide 20-25 Mg Tabs (Lisinopril-hydrochlorothiazide) .Marland Kitchen... Take 1 tablet by mouth once a day  Problem # 3:  CVA WITH LEFT HEMIPARESIS (ICD-438.20)  No new changes. i reinforced the importance fo controlling risk factors like BP control, smoking cessation and control of diabetes and cholesterol. PT to be continued as per PT/OT. patient ahs concers regarding cost of aggrenox and said that he cannot afford it. i reinforced the importance of  taking it and agreed to take it for next 2 months until he gets his medicare. His updated medication list for this problem includes:    Aggrenox 25-200 Mg Xr12h-cap (Aspirin-dipyridamole) .Marland Kitchen... Take 1 capsule by mouth two times a day  His updated medication list for this problem includes:    Aggrenox 25-200 Mg Xr12h-cap (Aspirin-dipyridamole) .Marland Kitchen... Take 1 capsule by mouth two times a day  Problem # 4:  HYPERCHOLESTEROLEMIA (ICD-272.0)  Well controlled on current meds. His updated medication list for this problem includes:    Pravachol 40 Mg Tabs (Pravastatin sodium) .Marland Kitchen... Take 1 tablet by mouth once a day  Labs Reviewed: SGOT: 16 (10/01/2009)   SGPT: 13 (10/01/2009)   HDL:37 (10/01/2009)  LDL:65 (10/01/2009)  Chol:121 (10/01/2009)  Trig:93 (10/01/2009)  His updated medication list for this problem includes:    Pravachol 40  Mg Tabs (Pravastatin sodium) .Marland Kitchen... Take 1 tablet by mouth once a day  Problem # 5:  Preventive Health Care (ICD-V70.0)  tetanus and pneumococcal shot today. Colonoscopy referrel given today. Reviewed preventive care protocols, scheduled due services, and updated immunizations.  Orders: Gastroenterology Referral (GI)  Complete Medication List: 1)  Metformin Hcl 500 Mg Tabs (Metformin hcl) .... Take 1 tablet by mouth two times a day 2)  Lisinopril-hydrochlorothiazide 20-25 Mg Tabs (Lisinopril-hydrochlorothiazide) .... Take 1 tablet by mouth once a day 3)  [facility]vs Omeprazole 20 Mg Tbec (Omeprazole) .... Take 1 tablet by mouth once a day 4)  Pravachol 40 Mg Tabs (Pravastatin sodium) .... Take 1 tablet by mouth once a day 5)  Aggrenox 25-200 Mg Xr12h-cap (Aspirin-dipyridamole) .... Take 1 capsule by mouth two times a day  Other Orders: T-Hgb A1C (in-house) (09811BJ) TD Toxoids IM 7 YR + (47829) Pneumococcal Vaccine (56213) Admin 1st Vaccine (08657) Admin of Any Addtl Vaccine (84696) Admin of Any Addtl Vaccine [facility]) (29528U)  Patient Instructions: 1)  Please schedule a follow-up appointment in 2 months. 2)  Limit your Sodium (Salt). 3)  Tobacco is very bad for your health and your loved ones! You Should stop smoking!. 4)  Stop Smoking Tips: Choose a Quit date. Cut down before the Quit date. decide what you will do as a substitute when you feel the urge to smoke(gum,toothpick,exercise). 5)  It is important that you exercise regularly at least 20 minutes 5 times a week. If you develop chest pain, have severe difficulty breathing, or feel very tired , stop exercising immediately and seek medical attention. 6)  You need to lose weight. Consider a lower calorie diet and regular exercise.  7)  Schedule a colonoscopy/sigmoidoscopy to help detect colon cancer. 8)  Check your blood sugars regularly. If your readings are usually above : 200 or below 70 you should contact our office. 9)  It is  important that your Diabetic A1c level is checked every 3 months. 10)  See your eye doctor yearly to check for diabetic eye damage. 11)  Check your feet each night for sore areas, calluses or signs of infection. 12)  Check your Blood Pressure regularly. If it is above:140/90  you should make an appointment. Prescriptions: AGGRENOX 25-200 MG XR12H-CAP (ASPIRIN-DIPYRIDAMOLE) Take 1 capsule by mouth two times a day  #60 x 11   Entered and Authorized by:   Lars Mage MD   Signed by:   Lars Mage MD on 10/29/2009   Method used:   Electronically to        [facility] (retail)       [address]       [place], [place]  [address]       Ph: [phone]       Fax: [phone]   RxID:   [id number] PRAVACHOL 40 MG TABS (PRAVASTATIN SODIUM) Take 1 tablet by mouth once a day  #30 x 6   Entered and Authorized by:   Lars Mage MD   Signed by:   Lars Mage MD on 10/29/2009   Method used:   Electronically to        [facility] (retail)       [address]       [place], [place]  [address]       Ph: [phone]       Fax: [phone]   RxID:   [id number] [facility] OMEPRAZOLE 20 MG TBEC (OMEPRAZOLE) Take 1 tablet by mouth once a day  #30 x 6   Entered and Authorized by:   Lars Mage MD   Signed by:   Lars Mage MD on 10/29/2009   Method used:   Electronically to        [facility] (retail)       [address]       [place], [place]  [address]       Ph: [phone]       Fax: [phone]   RxID:   [id number] LISINOPRIL-HYDROCHLOROTHIAZIDE 20-25 MG TABS (LISINOPRIL-HYDROCHLOROTHIAZIDE) Take 1 tablet by mouth once a day  #30 x 11   Entered and Authorized by:   Lars Mage MD   Signed by:   Lars Mage MD on 10/29/2009   Method used:   Electronically to        [facility] (retail)       [address]       [place], [place]  [address]       Ph: [phone]        Fax: [phone]   RxID:   [id number] METFORMIN HCL 500 MG TABS (METFORMIN HCL) Take 1 tablet by mouth two times a day  #60 x 11   Entered and Authorized by:   Lars Mage MD   Signed by:   Lars Mage MD on 10/29/2009   Method used:   Electronically to        [facility] (retail)       [address]       [place], [place]  [address]       Ph: [phone]       Fax: [phone]   RxID:   [id number]    Prevention & Chronic Care Immunizations   Influenza vaccine: Not documented   Influenza vaccine deferral: Not indicated  (09/11/2009)    Tetanus booster: 10/29/2009: Td   Td booster deferral: Refused  (09/11/2009)    Pneumococcal vaccine: Pneumovax (Medicare)  (10/29/2009)    H. zoster vaccine: Not documented   H. zoster vaccine deferral: Deferred  (10/29/2009)  Colorectal Screening   Hemoccult: Done.  (11/12/2004)   Hemoccult action/deferral: Deferred  (10/29/2009)    Colonoscopy: Not documented   Colonoscopy action/deferral: GI referral  (10/29/2009)  Other Screening   PSA: Not documented   PSA action/deferral: Discussed-PSA declined  (09/11/2009)   Smoking status: current  (  10/29/2009)   Smoking cessation counseling: yes  (10/29/2009)  Diabetes Mellitus   HgbA1C: 6.1  (10/29/2009)   HgbA1C action/deferral: Ordered  (10/29/2009)    Eye exam: Not documented   Diabetic eye exam action/deferral: Ophthalmology referral  (08/08/2009)    Foot exam: Not documented   Foot exam action/deferral: Deferred   High risk foot: Not documented   Foot care education: Not documented    Urine microalbumin/creatinine ratio: Not documented   Urine microalbumin action/deferral: Ordered    Diabetes flowsheet reviewed?: Yes   Progress toward A1C goal: At goal  Lipids   Total Cholesterol: 121  (10/01/2009)   Lipid panel action/deferral: Deferred   LDL: 65  (10/01/2009)   LDL Direct: Not documented   HDL: 37  (10/01/2009)   Triglycerides:  93  (10/01/2009)    SGOT (AST): 16  (10/01/2009)   BMP action: Deferred   SGPT (ALT): 13  (10/01/2009)   Alkaline phosphatase: 83  (10/01/2009)   Total bilirubin: 0.4  (10/01/2009)    Lipid flowsheet reviewed?: Yes   Progress toward LDL goal: At goal  Hypertension   Last Blood Pressure: 189 / 90  (10/29/2009)   Serum creatinine: 0.90  (08/08/2009)   BMP action: Ordered   Serum potassium 4.1  (08/08/2009)    Hypertension flowsheet reviewed?: Yes   Progress toward BP goal: At goal  Self-Management Support :   Personal Goals (by the next clinic visit) :     Personal A1C goal: 7  (09/11/2009)     Personal blood pressure goal: 140/90  (09/11/2009)     Personal LDL goal: 70  (09/11/2009)    Diabetes self-management support: Education handout  (10/29/2009)   Diabetes education handout printed   Last diabetes self-management training by diabetes educator: 09/11/2009    Hypertension self-management support: Education handout  (10/29/2009)   Hypertension education handout printed    Lipid self-management support: Education handout, Written self-care plan  (09/11/2009)    Nursing Instructions: Give tetanus booster today HgbA1C today (see order)     Tetanus/Td Vaccine    Vaccine Type: Td    Site: right deltoid    Mfr: Sanofi Pasteur    Dose: 0.5 ml    Route: IM    Given by: Chinita Pester RN    Exp. Date: 08/13/2011    Lot #: [id number]    VIS given: 08/30/07 version given October 29, 2009.  Pneumovax Vaccine    Vaccine Type: Pneumovax (Medicare)    Site: right deltoid    Mfr: Merck    Dose: 0.5 ml    Route: IM    Given by: Chinita Pester RN    Exp. Date: 11/08/2010    Lot #: [id number]    VIS given: 05/09/96 version given October 29, 2009.   Laboratory Results   Blood Tests   Date/Time Received: October 29, 2009 3:16 PM  Date/Time Reported: Burke Keels  October 29, 2009 3:16 PM   HGBA1C: 6.1%   (Normal Range: Non-Diabetic - 3-6%   Control Diabetic - 6-8%)

## 2010-11-12 NOTE — Miscellaneous (Signed)
Summary: [facility] Renewal Summary  [facility]   Imported By: Florinda Marker 11/11/2009 11:35:02  _____________________________________________________________________  External Attachment:    Type:   Image     Comment:   External Document

## 2010-11-13 NOTE — Assessment & Plan Note (Signed)
Summary: CHECKUP/SB.   Vital Signs:  Patient profile:   65 year old male Height:      72 inches (182.88 cm) Weight:      209.0 pounds (95.00 kg) BMI:     28.45 Temp:     98.0 degrees F (36.67 degrees C) oral Pulse rate:   64 / minute BP sitting:   113 / 77  (left arm)  Vitals Entered By: Cynda Familia Duncan Dull) (September 22, 2010 2:58 PM) Is Patient Diabetic? Yes Did you bring your meter with you today? No Pain Assessment Patient in pain? no      Nutritional Status BMI of 19 -24 = normal CBG Result 99  Have you ever been in a relationship where you felt threatened, hurt or afraid?No   Does patient need assistance? Functional Status Cook/clean, Shopping Ambulation Impaired:Risk for fall   Primary Care Bart Ashford:  Lars Mage MD   History of Present Illness: Patient is a 65 year old man with PMH as described in EMRwellknown to meis here today for a follow up.  No new complaints.  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 I think he will benefit from PM and R referral for botox shots so that he is more functional. Continue Aggrenox for now.  HTN: at goal.  Smoking: Patient was counseled on smoking cessation strategies including medications and behavior modification options. Quitnow number provided.  DM: Last Hba1c was 6.2. Optha exam referral today. Urine microalbumin today. Foot exam due next visit.  Hyperlipidemia: Compliant with his pravastatin. Will check lipid panel today.  Preventive Screening-Counseling & Management  Alcohol-Tobacco     Alcohol drinks/day: 0     Smoking Status: current     Smoking Cessation Counseling: yes     Smoke Cessation Stage: precontemplative     Packs/Day: 0.5     Pack years: 35  Problems Prior to Update: 1)  Diverticulosis of Colon  (ICD-562.10) 2)  Preventive Health Care  (ICD-V70.0) 3)  Hypertension  (ICD-401.9) 4)  Diabetes Mellitus, Type II   (ICD-250.00) 5)  Cva With Left Hemiparesis  (ICD-438.20) 6)  Hypercholesterolemia  (ICD-272.0) 7)  Erectile Dysfunction  (ICD-302.74) 8)  Diabetes Mellitus II, Uncomplicated  (ICD-250.00)  Medications Prior to Update: 1)  Metformin Hcl 500 Mg Tabs (Metformin Hcl) .... Take 1 Tablet By Mouth Two Times A Day 2)  Lisinopril-Hydrochlorothiazide 20-25 Mg Tabs (Lisinopril-Hydrochlorothiazide) .... Take 1 Tablet By Mouth Once A Day 3)  Cvs Omeprazole 20 Mg Tbec (Omeprazole) .... Take 1 Tablet By Mouth Once A Day 4)  Pravachol 40 Mg Tabs (Pravastatin Sodium) .... Take 1 Tablet By Mouth Once A Day 5)  Aggrenox 25-200 Mg Xr12h-Cap (Aspirin-Dipyridamole) .... Take 1 Capsule By Mouth Two Times A Day  Current Medications (verified): 1)  Metformin Hcl 500 Mg Tabs (Metformin Hcl) .... Take 1 Tablet By Mouth Two Times A Day 2)  Lisinopril-Hydrochlorothiazide 20-25 Mg Tabs (Lisinopril-Hydrochlorothiazide) .... Take 1 Tablet By Mouth Once A Day 3)  Cvs Omeprazole 20 Mg Tbec (Omeprazole) .... Take 1 Tablet By Mouth Once A Day 4)  Pravachol 40 Mg Tabs (Pravastatin Sodium) .... Take 1 Tablet By Mouth Once A Day 5)  Aggrenox 25-200 Mg Xr12h-Cap (Aspirin-Dipyridamole) .... Take 1 Capsule By Mouth Two Times A Day  Allergies (verified): No Known Drug Allergies  Past History:  Past Medical History: Last updated: 09/11/2009 Diabetes mellitus, type II Cerebrovascular accident, hx of Hypertension  Past Surgical History:  Last updated: 12-16-06 S/P ganglion cyst removal-wrist in 1965 - 07/12/2004  Family History: Last updated: 16-Dec-2006 Father died at 69 with DM., Mother alive at 65 with Breast Cancer.  Social History: Last updated: 09/22/2010 Evacuee from [place].  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  quit 2 years ago but restarted. Does not drink Etoh.  Eats a fairly healthy diet. Not working now and has applied for disablility. On medicare now.  Risk Factors: Alcohol Use: 0  (09/22/2010) Exercise: no (12/10/2009)  Risk Factors: Smoking Status: current (09/22/2010) Packs/Day: 0.5 (09/22/2010)  Family History: Reviewed history from 2006/12/16 and no changes required. Father died at 41 with DM., Mother alive at 65 with Breast Cancer.  Social History: Reviewed history from 08/08/2009 and no changes required. Evacuee from [place].  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  quit 2 years ago but restarted. Does not drink Etoh.  Eats a fairly healthy diet. Not working now and has applied for disablility. On medicare now.  Review of Systems      See HPI  Physical Exam  Additional Exam:  Gen: AOx3, in no acute distress, sitting in wheelchair comfortably. Eyes: PERRL, EOMI ENT:MMM, No erythema noted in posterior pharynx Neck: No JVD, No LAP Chest: CTAB with  good respiratory effort CVS: regular rhythmic rate, NO M/R/G, S1 S2 normal Abdo: soft,ND, BS+x4, Non tender and No hepatosplenomegaly EXT: No odema noted, left hemiparesis Neuro: left hemiparesis, shoulder 1/5, elbow/hands/ankle, knee=0/5 Skin: no rashes noted.    Impression & Recommendations:  Problem # 1:  CVA WITH LEFT HEMIPARESIS (ICD-438.20) Assessment Unchanged  Patient has been doing fine overall. Complains of progressive contracture over last 2 years which interferes with walking and daily work. Continue aggrenox for prevention. PM and R referral for BOTOX shots in the contracted muscle as needed.  His updated medication list for this problem includes:    Aggrenox 25-200 Mg Xr12h-cap (Aspirin-dipyridamole) .Marland Kitchen... Take 1 capsule by mouth two times a day  Orders: Rehabilitation Referral (Rehab)  Problem # 2:  DIABETES MELLITUS, TYPE II (ICD-250.00) Assessment: Improved Hba1c 5.8 today. Optha referral today. Urine microalbumin ratio. Continue metformin.  His updated medication list for this problem includes:    Metformin Hcl 500 Mg Tabs (Metformin hcl) .Marland Kitchen... Take 1  tablet by mouth two times a day    Lisinopril-hydrochlorothiazide 20-25 Mg Tabs (Lisinopril-hydrochlorothiazide) .Marland Kitchen... Take 1 tablet by mouth once a day  Orders: T- Capillary Blood Glucose (04540) T-Hgb A1C (in-house) ([id number])  Labs Reviewed: Creat: 0.77 (03/24/2010)    Reviewed HgBA1c results: 5.8 (09/22/2010)  6.2 (03/24/2010)  Problem # 3:  HYPERTENSION (ICD-401.9) Assessment: Improved At Goal. His updated medication list for this problem includes:    Lisinopril-hydrochlorothiazide 20-25 Mg Tabs (Lisinopril-hydrochlorothiazide) .Marland Kitchen... Take 1 tablet by mouth once a day  Problem # 4:  HYPERCHOLESTEROLEMIA (ICD-272.0) Assessment: Unchanged Check lipid panel today as patient is unable to come for fasting lipid panel. His updated medication list for this problem includes:    Pravachol 40 Mg Tabs (Pravastatin sodium) .Marland Kitchen... Take 1 tablet by mouth once a day  Orders: T-Lipid Profile ([id number])  Problem # 5:  Preventive Health Care (ICD-V70.0) Assessment: Comment Only Up to date except shingles. Reviewed preventive care protocols, scheduled due services, and updated immunizations.  Problem # 6:  CIGARETTE SMOKER (ICD-305.1) Assessment: Unchanged Patient was counseled on smoking cessation strategies including medications and behavior modification options.   Complete Medication List: 1)  Metformin Hcl 500 Mg  Tabs (Metformin hcl) .... Take 1 tablet by mouth two times a day 2)  Lisinopril-hydrochlorothiazide 20-25 Mg Tabs (Lisinopril-hydrochlorothiazide) .... Take 1 tablet by mouth once a day 3)  Cvs Omeprazole 20 Mg Tbec (Omeprazole) .... Take 1 tablet by mouth once a day 4)  Pravachol 40 Mg Tabs (Pravastatin sodium) .... Take 1 tablet by mouth once a day 5)  Aggrenox 25-200 Mg Xr12h-cap (Aspirin-dipyridamole) .... Take 1 capsule by mouth two times a day  Other Orders: T-Urine Microalbumin w/creat. ratio [id number]) T-Comprehensive Metabolic Panel  ([id number]) Ophthalmology Referral (Ophthalmology)  Patient Instructions: 1)  Please schedule a follow-up appointment in 6 months. 2)  Tobacco is very bad for your health and your loved ones! You Should stop smoking!. 3)  Stop Smoking Tips: Choose a Quit date. Cut down before the Quit date. decide what you will do as a substitute when you feel the urge to smoke(gum,toothpick,exercise). [id number] 4)  It is important that you exercise regularly at least 20 minutes 5 times a week. If you develop chest pain, have severe difficulty breathing, or feel very tired , stop exercising immediately and seek medical attention. 5)  You need to lose weight. Consider a lower calorie diet and regular exercise.    Orders Added: 1)  T- Capillary Blood Glucose [82948] 2)  T-Hgb A1C (in-house) [[id number]] 3)  T-Urine Microalbumin w/creat. ratio [[id number]] 4)  T-Lipid Profile [[id number]] 5)  T-Comprehensive Metabolic Panel [[id number]] 6)  Ophthalmology Referral [Ophthalmology] 7)  Rehabilitation Referral [Rehab] 8)  Est. Patient Level IV [96295]    Prevention & Chronic Care Immunizations   Influenza vaccine: Not documented   Influenza vaccine deferral: Not indicated  (09/11/2009)    Tetanus booster: 10/29/2009: Td   Td booster deferral: Deferred  (09/22/2010)    Pneumococcal vaccine: Pneumovax (Medicare)  (10/29/2009)    H. zoster vaccine: Not documented   H. zoster vaccine deferral: Deferred  (12/10/2009)  Colorectal Screening   Hemoccult: Done.  (11/12/2004)   Hemoccult action/deferral: Deferred  (09/22/2010)    Colonoscopy: Patient was diagnosed with colonic diverticulosis and need tofollow up with fecaloccult blood cards as out patient 3 times in next 4 years as per GI.  (03/24/2010)   Colonoscopy action/deferral: Deferred  (12/10/2009)  Other Screening   PSA: Not documented   PSA action/deferral: Discussed-PSA declined  (09/11/2009)   Smoking status: current   (09/22/2010)   Smoking cessation counseling: yes  (09/22/2010)  Diabetes Mellitus   HgbA1C: 5.8  (09/22/2010)   HgbA1C action/deferral: Ordered  (10/29/2009)    Eye exam: Not documented   Diabetic eye exam action/deferral: Ophthalmology referral  (09/22/2010)    Foot exam: Not documented   Foot exam action/deferral: Deferred   High risk foot: Not documented   Foot care education: Not documented    Urine microalbumin/creatinine ratio: Not documented   Urine microalbumin action/deferral: Ordered    Diabetes flowsheet reviewed?: Yes   Progress toward A1C goal: At goal  Lipids   Total Cholesterol: 121  (10/01/2009)   Lipid panel action/deferral: Lipid Panel ordered   LDL: 65  (10/01/2009)   LDL Direct: Not documented   HDL: 37  (10/01/2009)   Triglycerides: 93  (10/01/2009)    SGOT (AST): 11  (12/10/2009)   BMP action: Ordered   SGPT (ALT): <8 U/L  (12/10/2009) CMP ordered    Alkaline phosphatase: 97  (12/10/2009)   Total bilirubin: 0.3  (12/10/2009)    Lipid flowsheet reviewed?: Yes   Progress toward LDL goal: At goal  Hypertension  Last Blood Pressure: 113 / 77  (09/22/2010)   Serum creatinine: 0.77  (03/24/2010)   BMP action: Ordered   Serum potassium 3.7  (03/24/2010) CMP ordered     Hypertension flowsheet reviewed?: Yes   Progress toward BP goal: At goal  Self-Management Support :   Personal Goals (by the next clinic visit) :     Personal A1C goal: 7  (09/11/2009)     Personal blood pressure goal: 140/90  (09/11/2009)     Personal LDL goal: 70  (09/11/2009)    Diabetes self-management support: Written self-care plan  (09/22/2010)   Diabetes care plan printed   Last diabetes self-management training by diabetes educator: 09/11/2009    Hypertension self-management support: Written self-care plan  (09/22/2010)   Hypertension self-care plan printed.    Lipid self-management support: Written self-care plan  (09/22/2010)   Lipid self-care plan  printed.   Nursing Instructions: Refer for screening diabetic eye exam (see order)     Process Orders Check Orders Results:     [facility]: Check successful Tests Sent for requisitioning (September 22, 2010 5:58 PM):     09/22/2010: [facility] -- T-Urine Microalbumin w/creat. ratio [[id number]] (signed)     09/22/2010: [facility] -- T-Lipid Profile [phone] (signed)     09/22/2010: [facility] -- T-Comprehensive Metabolic Panel [phone] (signed)     Laboratory Results   Blood Tests   Date/Time Received: September 22, 2010 3:40 PM Date/Time Reported: Alric Quan  September 22, 2010 3:40 PM  HGBA1C: 5.8%   (Normal Range: Non-Diabetic - 3-6%   Control Diabetic - 6-8%) CBG Random:: 99mg /dL

## 2010-11-13 NOTE — Progress Notes (Signed)
Summary: results/ hla  Phone Note Call from Patient   Summary of Call: pt called requesting results informed dr will notify him if there is a concern Initial call taken by: Marin Roberts RN,  September 23, 2010 11:59 AM  Follow-up for Phone Call        Patient called and informed about his results. Follow-up by: Lars Mage MD,  September 24, 2010 3:57 PM

## 2010-11-13 NOTE — Progress Notes (Signed)
Summary: Refill/gh  Phone Note Refill Request Message from:  Fax from Pharmacy on September 25, 2010 9:44 AM  Refills Requested: Medication #1:  LISINOPRIL-HYDROCHLOROTHIAZIDE 20-25 MG TABS Take 1 tablet by mouth once a day Last office visit and labs were 09/22/2010   Method Requested: Electronic Initial call taken by: Angelina Ok RN,  September 25, 2010 9:44 AM  Follow-up for Phone Call        Refill approved-nurse to complete Follow-up by: Julaine Fusi  DO,  September 30, 2010 3:15 PM    Prescriptions: LISINOPRIL-HYDROCHLOROTHIAZIDE 20-25 MG TABS (LISINOPRIL-HYDROCHLOROTHIAZIDE) Take 1 tablet by mouth once a day  #30 x 11   Entered and Authorized by:   Julaine Fusi  DO   Signed by:   Julaine Fusi  DO on 09/30/2010   Method used:   Telephoned to ...         RxID:   [id number]

## 2010-11-19 ENCOUNTER — Telehealth: Payer: Self-pay | Admitting: Licensed Clinical Social Worker

## 2010-11-19 DIAGNOSIS — I639 Cerebral infarction, unspecified: Secondary | ICD-10-CM

## 2010-11-19 NOTE — Telephone Encounter (Addendum)
Patient left message looking for direction concerning assisted living placement.  He is interested in living at [facility].  The patient has left sided hemiparesis and walks with aid of walker.  He reports needing help with dressing, showering, meals.  He has a woman who comes in daily to help him that he pays out of pocket.  He receives $1,200 per month from [employer].  He lives in a boarding house and pays $450 per month in rent.  I have advised him of his options.  I've instructed patient to go to DSS and apply for Special Assistance Medicaid and advised him what identification and paperwork to bring with him.  I also educated him about the PACE program and that may be another option for him in terms of medical and in-home care.  He is less interested in PACE and more interested in AL placement at this time and Soc. Work will continue to help him with this process as he will need to qualify financially for this special Medicaid.    10/20/10  9:00 AM  Patient called again and left message that he would like an FL-2 completed since he is considering self-pay at [facility] but also is in the process of applying for Medicaid with the assistance of a friend.  Called back and we went over FL-2 items for completion.   FL-2 reviewed and signed by attending and faxed to [facility]. [place].   Original was mailed to patient to give to DSS.

## 2010-12-11 ENCOUNTER — Other Ambulatory Visit: Payer: Self-pay | Admitting: Internal Medicine

## 2010-12-23 LAB — GLUCOSE, CAPILLARY: Glucose-Capillary: 99 mg/dL (ref 70–99)

## 2011-01-01 ENCOUNTER — Encounter: Payer: Self-pay | Admitting: Internal Medicine

## 2011-01-13 LAB — GLUCOSE, CAPILLARY: Glucose-Capillary: 115 mg/dL — ABNORMAL HIGH (ref 70–99)

## 2011-01-15 LAB — CBC
HCT: 42 % (ref 39.0–52.0)
Hemoglobin: 15 g/dL (ref 13.0–17.0)
MCHC: 34.5 g/dL (ref 30.0–36.0)
MCHC: 35.7 g/dL (ref 30.0–36.0)
MCV: 96.9 fL (ref 78.0–100.0)
MCV: 97.6 fL (ref 78.0–100.0)
Platelets: 279 10*3/uL (ref 150–400)
Platelets: 321 10*3/uL (ref 150–400)
RBC: 4.34 MIL/uL (ref 4.22–5.81)
RDW: 14.4 % (ref 11.5–15.5)
WBC: 9.4 10*3/uL (ref 4.0–10.5)

## 2011-01-15 LAB — BASIC METABOLIC PANEL
BUN: 15 mg/dL (ref 6–23)
CO2: 22 mEq/L (ref 19–32)
Calcium: 9.6 mg/dL (ref 8.4–10.5)
Creatinine, Ser: 0.75 mg/dL (ref 0.4–1.5)
GFR calc Af Amer: >60 mL/min (ref >60)

## 2011-01-15 LAB — URINALYSIS, ROUTINE W REFLEX MICROSCOPIC
Protein, ur: NEGATIVE mg/dL
Urobilinogen, UA: 1 mg/dL (ref 0.0–1.0)

## 2011-01-15 LAB — LIPID PANEL
Cholesterol: 179 mg/dL (ref 0–200)
LDL Cholesterol: 122 mg/dL — ABNORMAL HIGH (ref 0–99)
Total CHOL/HDL Ratio: 5.8 RATIO

## 2011-01-15 LAB — GLUCOSE, CAPILLARY: Glucose-Capillary: 208 mg/dL — ABNORMAL HIGH (ref 70–99)

## 2011-01-15 LAB — RAPID URINE DRUG SCREEN, HOSP PERFORMED
Benzodiazepines: NOT DETECTED
Cocaine: NOT DETECTED
Opiates: NOT DETECTED
Tetrahydrocannabinol: NOT DETECTED

## 2011-01-15 LAB — COMPREHENSIVE METABOLIC PANEL
ALT: 20 U/L (ref 0–53)
Albumin: 4 g/dL (ref 3.5–5.2)
CO2: 25 mEq/L (ref 19–32)
Calcium: 9.3 mg/dL (ref 8.4–10.5)
Sodium: 140 mEq/L (ref 135–145)

## 2011-01-15 LAB — POCT CARDIAC MARKERS
CKMB, poc: 2.1 ng/mL (ref 1.0–8.0)
Myoglobin, poc: 133 ng/mL (ref 12–200)
Troponin i, poc: <0.05 ng/mL (ref 0.00–0.09)

## 2011-01-15 LAB — HEMOGLOBIN A1C: Hgb A1c MFr Bld: 6.5 % — ABNORMAL HIGH (ref 4.6–6.1)

## 2011-01-15 LAB — DIFFERENTIAL
Basophils Absolute: 0 10*3/uL (ref 0.0–0.1)
Eosinophils Absolute: 0.1 10*3/uL (ref 0.0–0.7)
Lymphs Abs: 1.6 10*3/uL (ref 0.7–4.0)
Monocytes Absolute: 0.5 10*3/uL (ref 0.1–1.0)
Neutro Abs: 7.1 10*3/uL (ref 1.7–7.7)
Neutrophils Relative %: 75 % (ref 43–77)

## 2011-01-15 LAB — POCT I-STAT, CHEM 8
HCT: 45 % (ref 39.0–52.0)
Hemoglobin: 15.3 g/dL (ref 13.0–17.0)
Potassium: 3.6 mEq/L (ref 3.5–5.1)
Sodium: 141 mEq/L (ref 135–145)

## 2011-01-15 LAB — CARDIAC PANEL(CRET KIN+CKTOT+MB+TROPI)
CK, MB: 3.5 ng/mL (ref 0.3–4.0)
Relative Index: 1.3 (ref 0.0–2.5)
Troponin I: 0.02 ng/mL (ref 0.00–0.06)

## 2011-01-15 LAB — PROTIME-INR: INR: 1.1 (ref 0.00–1.49)

## 2011-01-15 LAB — HIV ANTIBODY (ROUTINE TESTING W REFLEX): HIV: NONREACTIVE

## 2011-01-15 LAB — URINE CULTURE

## 2011-01-22 ENCOUNTER — Encounter: Payer: Self-pay | Admitting: Internal Medicine

## 2011-02-24 NOTE — Discharge Summary (Signed)
[name], Johnson               ACCOUNT NO.:  [id number]   MEDICAL RECORD NO.:  [id number]          PATIENT TYPE:  INP   LOCATION:  3039                         FACILITY:  [facility]   PHYSICIAN:  Pramod P. Pearlean Brownie, MD    DATE OF BIRTH:  Dec 19, 1945   DATE OF ADMISSION:  04/20/2007  DATE OF DISCHARGE:  04/26/2007                               DISCHARGE SUMMARY   DIAGNOSES AT TIME OF DISCHARGE:  1. 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  2. Hypertension.  3. Diabetes.  4. Hyperlipidemia.  5. History of left ganglion cyst resection, left wrist.  6. Obesity.  7. Medical noncompliance prior to admission.  8. Cigarette smoker.   STUDIES PERFORMED:  1. CT of the brain on admission showed no acute intracranial      abnormality.  Encephalomalacia in the left frontal and right      frontal parietal regions.  2. MRI of the brain shows acute/subacute infarct, posterior limb right      internal capsule and superolateral thalamus on the right.  Remote      infarct encephalomalacia right temporal parietal lobe with      extension to the atrium  of the right lateral ventricle.  Minimal      subcortical white matter changes.  3. MRA of the head shows probable small vessel atherosclerotic changes      within the right greater than left MCA branches.  Focal stenosis or      probable flow disturbance in the proximal basilar artery.  This      could be source of embolus.  Bilateral P2 segment stenosis, left      greater than right.  Proximal left M1 segment, probable high-grade      stenosis.  4. MRA of the neck shows moderate narrowing, proximal right vertebral      artery extending for 1.7 cm.  Probable high-grade stenosis proximal      basilar artery.  Probable stenosis proximal left M1 segment      confirmed.  5. Follow-up CT.  Post TPA shows a well-defined internal capsule      infarct on the right side.  Remote right parietal and left frontal      infarcts.  No new  findings.  No hemorrhage.  6. A 2-D echocardiogram shows EF of 70% to 75% with no left      ventricular regional wall motion abnormalities.  No source of      embolus.  7. Carotid Doppler shows no ICA stenosis.  Vertebral flow antegrade      bilaterally.  8. EKG shows sinus bradycardia with first-degree AV block, minimal      voltage criteria for LVH, maybe normal variant.   LABORATORY STUDIES:  CBC with hemoglobin 13.2, hematocrit 38.5, white  blood cells initially 16.1, down to 15.1.  Neutrophils initially 81,  down to 78. Chemistry with BUN 5, creatinine 0.5, glucose initially 255  down to 193, otherwise normal coagulation studies.  On admission with  INR 1.5, PT 18.5, PTT.  Liver function tests normal.  Cardiac enzymes  negative.  Cholesterol 202, triglycerides 153, HDL 33, LDL 138.  Urinalysis shows 3 to 6 white blood cells, 21 to 50 red blood cells, few  bacteria.  Homocystine 6.7.  Urine drug screen negative.  Hemoglobin A1c  11.8.   MEDICINES AT TIME OF DISCHARGE:  1. Nicotine patch 21 mg for 24 hours x7 days then decrease to 14 mg,      then per taper on rehab.  2. Aspirin 325 mg a day.  3. Hydrochlorothiazide 25 mg a day.  4. Altace 5 mg a day.  5. Zocor 20 mg a day.  6. Lovenox 40 mg a day subcu.  7. Colace 200 mg q.h.s.  8. Glyburide 5 mg q.a.m.   HISTORY OF PRESENT ILLNESS:  [name] is a 65 year old right-  handed African-American male with a history of hypertension and diabetes  but is not currently receiving follow-up.  The patient has been seen  previously at [facility] family practice but has not been actively  followed.  He has been off all medications.  He has been living in  [place] with a roommate.  Around 3 o'clock today, he noted onset of  slurred speech and problems with generalized weakness, dizziness and  diaphoresis.  The patient was noted to have left-sided weakness and was  brought to the hospital via EMS.  Code stroke was called.  CT of  the  brain was done.  It showed evidence of old left frontal stroke and an  old right frontal stroke.  The patient has no history of strokes  clinically.  The patient appeared to have a pure motor lacune by  clinical examination with face, arm and leg involvement.  He had no  sensory involvement or visual involvement.  NIH stroke scale was 9.  He  had mild dysarthria.  He was a t-PA candidate, so full-dose IV t-PA was  administered.  He was admitted to the ICU for further evaluation.   HOSPITAL COURSE:  Blood pressure in the emergency room was 190/100  initially.  He was placed on Cardene drip in the emergency and then  continued in the ICU.  P.O. anti-hypertensive for then added, once it  was confirmed he could swallow, and Cardene was weaned.  After 24 hours,  the patient was moved out of the ICU.  A 24-hour x-ray showed no  hemorrhage post t-PA.  The patient continued with dense left hemiparesis  throughout most of his hospital stay, though he is having some  improvement, especially his left lower extremity by the time of  discharge.  He was evaluated by PT/OT, who both recommended inpatient  rehab.  Arrangements were made, and the patient is to be transferred  there.  Of note, the patient was also started on antihypertensive.  He  was started on a Statin, as well as aspirin for stroke prevention.  He  was placed on a nicotine patch, to help him with smoking cessation.  Glyburide was added for blood sugar control, and he may need additional  agents, as he progresses.   CONDITION ON DISCHARGE:  The patient alert and oriented x3.  No aphasia  or dysarthria.  His eye movements are full.  He has left facial  weakness.  His left upper extremity is 0/5.  His left lower extremity is  2 to 3/5.  He has decreased sensation on the left.   DISCHARGE/PLAN:  1. Transfer him to rehab for continuation of PT/OT.  2. Aspirin for secondary stroke prevention.  3. The patient needs aggressive risk  factor control of blood pressure,      diabetes, and dyslipidemia.   FOLLOW UP:  1. Dr. Pearlean Brownie in his office in 2 to 3 months.  2. Obtain a primary care physician upon discharge from rehab and      follow up with them within 1 month of discharge.      Annie Main, N.P.    ______________________________  Sunny Schlein. Pearlean Brownie, MD    SB/MEDQ  D:  04/26/2007  T:  04/27/2007  Job:  [id number]   cc:   Pramod P. Pearlean Brownie, MD

## 2011-02-24 NOTE — H&P (Signed)
Tony Johnson, SEDGWICK NO.:  [id number]   MEDICAL RECORD NO.:  [id number]          PATIENT TYPE:  IPS   LOCATION:  4005                         FACILITY:  [facility]   PHYSICIAN:  Ranelle Oyster, M.D.DATE OF BIRTH:  1945-11-21   DATE OF ADMISSION:  04/26/2007  DATE OF DISCHARGE:                              HISTORY & PHYSICAL   CHIEF COMPLAINT:  Left-sided weakness.   HISTORY OF PRESENT ILLNESS:  This is a 65 year old black male with a  history of hypertension and diabetes with poor medical compliance who  presented on April 20, 2007, with left-sided weakness and slurred speech.  CT was notable for acute abnormalities on the left frontal and right  frontoparietal areas.  The right frontoparietal area was notable for  encephalomalacia.  The patient was treated with TPA for a right brain  stroke.  MRI was notable for acute and subacute infarcts in the right  internal capsule and superolateral thalamus with remote infarcts in the  right temporal parietal lobe, small vessel disease and focal stenosis of  the proximal basilar artery.  He had moderate narrowing of the right  vertebral artery, as well.  Echocardiogram was unremarkable.  ICAs were  without stenosis.  The patient was placed on aspirin and Coumadin for  stroke prophylaxis.  He continues with left-sided weakness, particularly  in the left upper extremity, with decreased sensation on the left.  Patient has poor awareness of that decreased sensation, however.  The  patient requires max assist for mobility and self care.  It was thus  decided to bring him to the inpatient rehab unit today.   REVIEW OF SYSTEMS:  Review of systems is notable for the above.  Denies  any shortness of breath, chest pain.  Appetite has been good.  A full  review of systems is in the written H&P.   PAST MEDICAL HISTORY:  1. Diabetes type 2.  2. Hypertension.  3. Excision of a left wrist ganglion cyst and obesity.   FAMILY HISTORY:   Family history is positive for stroke and hypertension.   SOCIAL HISTORY:  The patient is divorced, relocated secondary to  [place], initially.  Lives in a one-level boarding house with 3 to 4  steps to enter.  Used to work for the city.  He was planning to go back  to [place] anytime now, up until the stroke took place.  The patient  was independent in driving otherwise prior to arrival.   ALLERGIES:  PENICILLIN AND TB TEST.   HOME MEDICATIONS:  None.   LABS:  Hemoglobin 13.2, white count 15.1, platelets 320,000, sodium 138,  potassium 3.8, BUN and creatinine 5 and 0.6.   PHYSICAL EXAMINATION:  VITAL SIGNS:  Blood pressure is 140/82, pulse is  62, respiratory rate 20, temperature 97.8.  GENERAL:  The patient is pleasant, alert and oriented x3.  EYES:  Pupils are equally round and reactive to light.  Sclerae are  anicteric.  ENT:  Ear, nose and throat exam is notable for poor dentition with two  remaining teeth, decayed in appearance.  Breath is foul-smelling.  NECK:  Neck is supple without JVD or lymphadenopathy.  CHEST:  Chest is clear to auscultation bilaterally without wheezes,  rales or rhonchi.  HEART:  Heart is regular rate and rhythm without murmur, rubs or  gallops.  EXTREMITIES:  Extremities show no clubbing, cyanosis or edema.  ABDOMEN:  Abdomen is soft, nontender.  SKIN:  Skin was generally intact.  NEUROLOGIC:  Cranial nerves II-XII reveal left central VII.  They seemed  to track fairly well and had intact vision.  He did have some left  inattention, however.  Sensation was decreased at 1 out of 2 with  inattention playing a role there, as well.  Motor function was 0/5 left  upper extremity except for, perhaps, trace of the biceps and, to a  lesser extent, the triceps.  The left lower extremity is 2 to 2+ out of  5 proximal to distal.  Judgment, orientation, memory and mood were all  fair.  Speech is slightly dysarthric.   ASSESSMENT/PLAN:  1. Functional  deficits, secondary to right internal carotid artery and      superolateral thalamic stroke:  Begin comprehensive inpatient rehab      with PT to assess and treat for range of motion and pre gait      training, etc.  OT will assess and treat for range of motion,      activities of daily living, cognizant/perceptional training and      equivalent.  Speech language, pathology will follow for      communication and safety.  Rehab case manager/social worker will      assess for psychosocial needs at discharge planning.  Estimated      length of stay is two to three weeks.  Goal is supervision to      modified independent.  Prognosis:  Fair.  2. Diabetes type 2:  Patient on glyburide.  Add Glucophage, as well,      and monitor CBGs.  Cover sliding scale of insulin as needed.  3. Hypertension:  Continue Altace and observe daily blood pressures.  4. Dyslipidemia:  Zocor.  5. Zocor.  6. DVT prophylaxis with subcutaneous Lovenox.  7. Stroke prophylaxis with aspirin 325 mg daily.  8. Dental:  Chlorhexidine rinse b.i.d.  Patient likely needs further      dental followup as an outpatient.      Ranelle Oyster, M.D.  Electronically Signed     ZTS/MEDQ  D:  04/26/2007  T:  04/27/2007  Job:  [id number]

## 2011-02-24 NOTE — Discharge Summary (Signed)
NAMEJOACHIM, Tony Johnson NO.:  [id number]   MEDICAL RECORD NO.:  [id number]          PATIENT TYPE:  IPS   LOCATION:  4005                         FACILITY:  [facility]   PHYSICIAN:  Tony Johnson, M.D.   DATE OF BIRTH:  August 27, 1946   DATE OF ADMISSION:  04/26/2007  DATE OF DISCHARGE:                               DISCHARGE SUMMARY   DISCHARGE DIAGNOSES:  1. Right cerebrovascular accident.  2. Hypertension.  3. Diabetes mellitus type 2.  4. Dyslipidemia.  5. Escherichia coli urinary tract infection, treated.   HISTORY OF PRESENT ILLNESS:  [name] is a 65 year old male with  history of hypertension, diabetes mellitus, no medications for the last  year or so due to financial constraints, admitted April 20, 2007, with  left-sided weakness and slurred speech.  CT of head showed no acute  abnormality; left frontal, right frontoparietal encephalomalacia.  The  patient was treated with tPA for right brain stroke.  MRI/MRA of brain  showed acute/subacute infarct right internal capsule and superior  lateral thalamus, remote infarcts right temperoparietal lobe, small-  vessel disease and focal stenosis proximal basilar artery with moderate  narrowing of right vertebral artery.  Carotid Dopplers showed no ICA  stenosis.  A 2-D echocardiogram showed EF of 70-75%.  The patient  started on aspirin for CVA prophylaxis.  He currently continues with  left facial weakness, left upper extremity flaccidity, left lower  extremity weakness, and decrease in sensation on the left.  Therapies  initiated and the patient requiring total assist 75% to take a few steps  with verbal cues and manual assist for left lower extremity weight  shifting and foot placement.  Left upper extremity neglect reported.  Rehab consulted for further therapies.   PAST MEDICAL HISTORY:  Significant for:  1. DM type 2.  2. Hypertension.  3. Excision left wrist ganglion cyst.   ALLERGIES:  1. PENICILLIN.  2. TB  TEST.   FAMILY HISTORY:  Positive for CVA and hypertension.   SOCIAL HISTORY:  The patient is divorced, was relocated due to [place].  Lives in one-level boarding home with three to four steps at entry.  Used to work for the city until 2 months ago; currently unemployed.  Smokes one pack per day tobacco.  Does not use any alcohol.  The patient  was independent and driving prior to admission.   HOSPITAL COURSE:  [name] was admitted to rehab on April 26, 2007, for inpatient therapies to consist of PT/OT daily.  Past admission  he was maintained on aspirin for CVA prophylaxis.  Hydrochlorothiazide  and Altace are being used for blood pressure management.  Blood  pressures during this stay have ranged from 120s to 130s systolic, 60s  to 70s diastolic.  The patient's diabetes was monitored with a.c./h.s.  CBG checks.  Currently, blood sugars ranging from 60s to 120s with  occasional high in 150s.  The patient continues on Glucophage 850 mg  p.o. b.i.d. to avoid hyperglycemic episodes.  Labs done past admission  reveal hemoglobin 14.4, hematocrit 42.3, white count 11.8, platelets  365.  Check of electrolytes revealed sodium 136, potassium 4.2, chloride  104, CO2 23, BUN 12, creatinine 0.71, glucose 195.  UA/UC done at time  of admission showed greater than 100,000 colonies of E. coli.  The  patient was treated with Cipro x7 days for this.   The patient has been continent of bowel and bladder.  He has had  increasing left lower extremity strength.  OT is focusing on trunk  stabilization as well as strengthening of left upper extremity and  standing balance with increase in left lower extremity weightbearing for  self-care needs.  An MRE with __________ has been used during exercising  and using of left upper extremity.  MRE has focused on sideline gravity  elimination with left shoulder flexion, extension, external-internal  rotation, elbow extension-flexion, and scapula  mobilization range of  motion.  Speech therapy has worked with the patient in terms of his left  facial weakness.  The patient is independent for performing OME  exercises for left labial and lingual function.  Speech therapy has also  been working with the patient to help compensate for left-field neglect,  as well as high-level cognition, comprehension, and identifying  potential problems and logical solution for safety situation in daily  activities.  Currently, the patient's high-level expression and  comprehension is intact.  Physical therapy has been working with the  patient to help assess mobility.  Currently, the patient is at modified  independent at wheelchair level.  He is modified independent for  transfers, modified independent for dynamic standing balance, able to  ambulate 50 feet with rolling walker at modified independent for home  environment, requires minimum assist to navigate four stairs.  An  assisted living is available on August 13 and the patient is to be  discharged to this facility.   DISCHARGE MEDICATIONS:  1. Glucophage 850 mg b.i.d.  2. Ecotrin 325 mg a day.  3. Hydrochlorothiazide 25 mg a day.  4. Multivitamin one per day.  5. Nicotine patch 14 mg daily x3 weeks, then decrease to 7 mg daily x3      weeks, then discontinue.  6. Aricept 5 mg a day.  7. Prilosec OTC 20 mg per day p.r.n.  8. Peridex oral rinse 5-10 mL p.o. b.i.d. swish and spit.  9. Zocor 20 mg a day.   ACTIVITY LEVEL:  As tolerated at wheelchair mobility with intermittent  supervision.   SPECIAL INSTRUCTIONS:  Continue taking blood sugars b.i.d. rotating  basis.   DIET:  Carbohydrate-modified diet.   FOLLOWUP:  The patient is to follow up with LMD regarding medical  issues, follow up with Dr. Pearlean Johnson in 4 weeks, follow up with Dr. Thomasena Johnson  in 4 weeks.     Tony Johnson, P.A.    ______________________________  Tony Johnson, M.D.   PP/MEDQ  D:  05/24/2007  T:  05/24/2007   Job:  [id number]   cc:   Tony P. Tony Brownie, MD

## 2011-02-24 NOTE — H&P (Signed)
Tony Johnson NO.:  [id number]   MEDICAL RECORD NO.:  [id number]          PATIENT TYPE:  EMS   LOCATION:  MAJO                         FACILITY:  [facility]   PHYSICIAN:  Marlan Palau, M.D.  DATE OF BIRTH:  1945/10/18   DATE OF ADMISSION:  04/20/2007  DATE OF DISCHARGE:                              HISTORY & PHYSICAL   HISTORY OF PRESENT ILLNESS:  Tony Johnson is a 65 year old right-handed  black male born Dec 29, 1945, with a history of hypertension and  diabetes that is currently not receiving follow-up.  Patient has been  seen previously by the [facility], but has not been  actively followed.  Has been off all medications.  The patient had been  living in the [place] area.  Was with a roommate  around 3 o'clock today when he noted onset of slurred speech and  problems with generalized weakness, dizziness, diaphoresis.  The patient  then was noted to have left-sided weakness, was brought to the hospital  via EMS.  Code stroke was called.  CT scan of the brain has been done.  Shows evidence of an old left frontal stroke and an old right frontal  parietal stroke.  The patient has had no history of strokes clinically.  The patient appears to have pure motor lacunar by clinical examination  with face, arm and leg involvement.  No sensory involvement, no visual  involvement.  NIH stroke scale was 9.  The patient has some mild  dysarthria.  The patient is felt to be a candidate for t-PA and this  will be initiated prior to admission.   PAST MEDICAL HISTORY:  1. history diabetes untreated.  2. History of hypertension untreated.  3. History of left ganglion cyst resection in the left wrist in the      past.  4. History of obesity.  5. Medical noncompliance.  The patient was on no medications prior to      admission and was not on aspirin.   ALLERGY:  PENICILLIN   HABITS:  Does not drink.  Smokes a pack of cigarettes  daily.  Denies use  of illicit drugs.   SOCIAL HISTORY:  The patient is divorced, has one son, is currently not  working.   FAMILY MEDICAL HISTORY:  Mother is alive, has history of hypertension.  Father died has with a history of stroke.  Has hypertension.  Patient  has four brothers, four sisters all alive and well.   REVIEW OF SYSTEMS:  Notable for no recent fevers, chills.  The patient  has had some night sweats off and on over the last couple days.  Denies  headache, neck pain.  Denies significant shortness of breath, has had  some dizziness today.  Denies chest pains, chest pressure.  Denies  abdominal pain, nausea, vomiting, does report some constipation.  Denies  any problems with blacking out spells, seizures.   PHYSICAL EXAMINATION:  VITALS:  Blood pressure is 190/100 initially,  heart rate 62, respiratory rate 14, temperature afebrile.  GENERAL: The patient is a moderately obese black  male who is alert,  cooperative at the time of the examination.  HEENT: Examination is atraumatic.  Eyes: Pupils equal, round and to  light.  Disks are flat bilaterally.  NECK:  Supple.  No carotid bruits.  RESPIRATORY:  Clear.  CARDIOVASCULAR: The patient has a regular rhythm.  No obvious murmurs,  rubs noted.  EXTREMITIES:  Without significant edema.  ABDOMEN:  Reveals positive bowel sounds.  No organomegaly or tenderness  noted.  NEUROLOGICAL:  Motor testing reveals almost plegic left arm.  The  patient is able to extend slightly with left arm.  Patient has  significant weak left leg as well and was able to dorsiflex and flex  extend the toes some.  Has prominent lower facial weakness on the left.  Patient has full extraocular movements.  Visual fields are full.  Speech  is slightly dysarthric.  Good pinprick sensation on the face  bilaterally.  Good pinprick sensation was seen on the left arm, left  leg, right arm, right leg.  The patient has good finger-nose-finger and  heel-to-shin  on the right, cannot perform on the left.  Patient has no  evidence of sensory extinction.  Has full vision with double  simultaneous stimulation.  No aphasia is noted.  The patient has  somewhat depressed but symmetric reflexes throughout.  Upgoing toe on  the left.  Neutral on the right.   LABORATORY DATA:  INR 1.2, white count of 11.4, hemoglobin of 13.8,  hematocrit 40.6, MCV of 92.9, platelets of 323.  Chemistries are pending   EKG reveals sinus bradycardia, first-degree AV block, heart rate 56,  septal infarct age undetermined.   IMPRESSION:  1. New onset left hemiparesis consistent with a subcortical stroke as      a pure motor lacune.  2. Prior old cortical infarcts, bihemispheric, rule out embolic source      previously.  3. Hypertension untreated.  4. Diabetes untreated.  5. Medical noncompliance.   The patient has multiple risk factors for stroke events.  The patient is  a smoker with hypertension, diabetes, that have been untreated.  The  patient will be admitted to [facility] for evaluation of acute  stroke event.  The patient is well within the time frame for t-PA to be  given.   PLAN:  1. Pressure control on urgent basis.  2. IV TPA full dose.  3. Admission to neuroscience intensive care unit.  4. MRI of the brain.  5. MRI angiogram of the intracranial vessels.  6. Echocardiogram.  7. Physical, occupational and rehab evaluation.  Will follow patient's      clinical course while in-house.      Marlan Palau, M.D.  Electronically Signed     CKW/MEDQ  D:  04/20/2007  T:  04/21/2007  Job:  [id number]

## 2011-02-24 NOTE — Assessment & Plan Note (Signed)
Tony Johnson returns to the clinic today for followup evaluation coming in  from [facility].  The patient is a 65 year old male  with history of hypertension and diabetes mellitus.  He has had no  treatment for these problems including no use of medications for the  past year or so due to financial constraints.  He was admitted April 20, 2007, with left-sided weakness and slurred speech.  CT of the brain  showed no acute abnormality, although there was left frontal and right  frontoparietal encephalomalacia.  The patient was treated with TPA for a  right brain stroke.  Followup MRI and MRA study of the brain showed any  acute/subacute infarct in the right internal capsule and superolateral  thalamus with remote infarcts in the right temporoparietal lobe and  small vessel disease along with focal stenosis proximally of the basilar  artery with moderate narrowing of the right vertebral artery.  Carotid  Dopplers showed no internal carotid artery stenosis.  Two-D  echocardiogram showed an ejection fraction of 70 to 75%.  The patient  was placed on aspirin for stroke prophylaxis.   He moved to the rehabilitation unit April 26, 2007, and was discharged to  [facility] as of May 24, 2007.   At the [facility], the patient reports that he  receives therapy on a daily basis.  He reports that he is ambulating 50  yards with a rolling walker and requires some help with bathing and  dressing secondary to the left upper extremity weakness.  He still has  desires to return to [place] and has been in contact with a cousin of  his.  The cousin is considering allowing Tony Johnson to move down there  and be with he and his wife in their home.  The patient reports that he  has no desire to move to [place] where he has other family members.  He  was born and raised for the most part in [place].   Paperwork from [facility] indicates that his blood  sugars have  been in the 90 to 170 range with mostly readings in the 120s to 130s on  his Glucophage.  He has no primary care physician at [facility] as best I can determine.   The patient reports that he has trouble sleeping secondary to pain and  would like to take two Tylenol at night.  There is no prescription for  that at the nursing home at this point.   MEDICATIONS:  1. Aspirin 325 mg p.o. daily.  2. Hydrochlorothiazide 25 mg daily.  3. Multivitamin daily.  4. Prilosec over-the-counter 20 mg daily.  5. Altace 5 mg daily.  6. Zocor daily.  7. Glucophage 850 mg b.i.d.   REVIEW OF SYSTEMS:  Noncontributory.   PHYSICAL EXAMINATION:  VITAL SIGNS:  Not obtained in the office today.  GENERAL:  He is a well-appearing, middle-aged, adult male with  significant left-sided weakness.  He ambulates with a rolling walker  with a support on his left wrist.   Right upper and right lower extremity strength was 5/5.  Left upper  extremity strength was 3-/5, and left lower extremity strength was 3+/5.   IMPRESSION:  1. 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  2. Type 2 diabetes mellitus.  3. Hypertension.  4. Dyslipidemia.   In the office today, no adjustments in medications were necessary other  than  adding Tylenol 325 mg two tablets p.o. q.h.s.  He will be using  that mostly for sleep as it has helped him in the past and eased some of  his musculoskeletal pain.  I told the patient that he needs to make sure  that he has a good place to go in [place] before he decides to move  there.  He has been living in a group home in [place], but that  is really not appropriate for him at this time.  Hopefully a family  member such as a cousin would be able to take him in, in [place].  Once he gets to [place], then he will have to discuss medical care  followup possibly through the [facility] or the [facility] in  [place].  He  plans to hopefully move to [facility] if his cousin can  accommodate him at their home.   We will plan on seeing the patient in followup if he is still in the  area in approximately three months time.           ______________________________  Ellwood Dense, M.D.     DC/MedQ  D:  06/23/2007 10:34:26  T:  06/23/2007 19:33:16  Job #:  [id number]

## 2011-02-24 NOTE — Assessment & Plan Note (Signed)
Mr. Arboleda returns to clinic today for follow-up evaluation.  He has left  [facility] nursing home and is living with friends at the present  time in [place].  He still plans to move to [place] over the next  several weeks to months.  He asks for a script for therapy so he can get  that started once he gets down to [place].  His other medicines have  been unchanged and he needs refills on each of those in the office  today.  He is not checking his blood sugars at all at this point.  He  walks with a rolling walker but is in the office today in a wheelchair.  He is independent with bathing and dressing according to his reports.   MEDICATIONS:  1. Aspirin 325 mg q. day.  2. Hydrochlorothiazide 25 mg q. day.  3. Multivitamin daily.  4. Altace 5 mg q. day.  5. Zocor daily.  6. Glucophage 850 mg b.i.d.  7. Prilosec over-the-counter 20 mg q. day.   REVIEW OF SYSTEMS:  Non-contributory.   PHYSICAL EXAMINATION:  GENERAL:  Well-appearing middle aged adult male  seated in a manual wheelchair.  VITALS:  Blood pressure and vitals were not obtained in the office  today.  EXTREMITIES:  He has significant left upper extremity weakness but has  decent strength in the left lower extremity.  Right sided strength was  all 5/5.   IMPRESSION:  1. 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      Type 2 diabetes mellitus.  2. Hypertension.  3. Dyslipidemia.  4. In the office today we did refill the patient's medicines including      his Altace, Zocor, Glucophage, and hydrochlorothiazide.  Will      schedule him for follow-up in approximately 3-4 months time but he      may well be living in [place] at that time.  If he has any      further questions he can certainly contact this office prior to      moving to the [place].  We will plan on seeing him in follow-up if he      still lives in the area in 3-4 months.           ______________________________  Ellwood Dense, M.D.     DC/MedQ  D:  12/15/2007 08:55:17  T:  12/15/2007 09:06:08  Job #:  [id number]

## 2011-07-13 LAB — URINALYSIS, ROUTINE W REFLEX MICROSCOPIC
Glucose, UA: NEGATIVE
Nitrite: NEGATIVE
Specific Gravity, Urine: 1.015
pH: 7.5

## 2011-07-13 LAB — POCT I-STAT, CHEM 8
Chloride: 108
HCT: 42
Hemoglobin: 14.3
Potassium: 3.9
Sodium: 141

## 2011-07-13 LAB — DIFFERENTIAL
Basophils Absolute: 0.1
Eosinophils Relative: 1
Lymphocytes Relative: 15
Lymphs Abs: 1.2
Monocytes Absolute: 0.5
Neutro Abs: 6.3

## 2011-07-13 LAB — CBC
HCT: 41
Hemoglobin: 13.9
RDW: 14.8
WBC: 8.2

## 2011-07-27 LAB — URINALYSIS, ROUTINE W REFLEX MICROSCOPIC
Bilirubin Urine: NEGATIVE
Nitrite: NEGATIVE
Specific Gravity, Urine: 1.014
Urobilinogen, UA: 1

## 2011-07-27 LAB — DIFFERENTIAL
Basophils Absolute: 0.1
Basophils Relative: 1
Monocytes Relative: 9
Neutro Abs: 8 — ABNORMAL HIGH
Neutrophils Relative %: 68

## 2011-07-27 LAB — CBC
HCT: 42.3
Hemoglobin: 14.4
MCV: 93.6
RDW: 14.7 — ABNORMAL HIGH

## 2011-07-27 LAB — COMPREHENSIVE METABOLIC PANEL
Alkaline Phosphatase: 103
BUN: 12
Creatinine, Ser: 0.71
Glucose, Bld: 195 — ABNORMAL HIGH
Potassium: 4.2
Total Bilirubin: 0.7
Total Protein: 6.6

## 2011-07-27 LAB — URINE CULTURE

## 2011-07-28 LAB — DIFFERENTIAL
Basophils Absolute: 0.1
Basophils Relative: 0
Basophils Relative: 1
Eosinophils Absolute: 0.1
Eosinophils Absolute: 0.2
Eosinophils Relative: 1
Eosinophils Relative: 1
Lymphocytes Relative: 15
Monocytes Absolute: 0.7
Monocytes Absolute: 0.9 — ABNORMAL HIGH
Monocytes Relative: 6
Monocytes Relative: 7
Neutro Abs: 11.8 — ABNORMAL HIGH
Neutro Abs: 8.4 — ABNORMAL HIGH
Neutrophils Relative %: 81 — ABNORMAL HIGH

## 2011-07-28 LAB — CK TOTAL AND CKMB (NOT AT ARMC)
CK, MB: 2.5
CK, MB: 2.6
Relative Index: 1.7

## 2011-07-28 LAB — BASIC METABOLIC PANEL
BUN: 5 — ABNORMAL LOW
Calcium: 8.5
Creatinine, Ser: 0.59
GFR calc non Af Amer: >60
Glucose, Bld: 193 — ABNORMAL HIGH

## 2011-07-28 LAB — DRUGS OF ABUSE SCREEN W/O ALC, ROUTINE URINE
Amphetamine Screen, Ur: NEGATIVE
Barbiturate Quant, Ur: NEGATIVE
Creatinine,U: 187.2
Marijuana Metabolite: NEGATIVE
Methadone: NEGATIVE

## 2011-07-28 LAB — CBC
Hemoglobin: 13.8
Hemoglobin: 15.1
MCHC: 33.3
Platelets: 320
RBC: 4.37
RBC: 4.84
RDW: 14.7 — ABNORMAL HIGH

## 2011-07-28 LAB — LIPID PANEL
Cholesterol: 202 — ABNORMAL HIGH
HDL: 33 — ABNORMAL LOW
Total CHOL/HDL Ratio: 6.1

## 2011-07-28 LAB — URINE CULTURE: Colony Count: 40000

## 2011-07-28 LAB — COMPREHENSIVE METABOLIC PANEL
ALT: 15
ALT: 16
AST: 16
Albumin: 3.4 — ABNORMAL LOW
Albumin: 3.8
Alkaline Phosphatase: 112
Alkaline Phosphatase: 98
GFR calc Af Amer: >60
Glucose, Bld: 255 — ABNORMAL HIGH
Potassium: 3.7
Potassium: 3.9
Sodium: 132 — ABNORMAL LOW
Sodium: 136
Total Protein: 6.6
Total Protein: 7.1

## 2011-07-28 LAB — URINALYSIS, ROUTINE W REFLEX MICROSCOPIC
Glucose, UA: 250 — AB
Specific Gravity, Urine: 1.01

## 2011-07-28 LAB — TROPONIN I: Troponin I: 0.03

## 2011-07-28 LAB — URINE MICROSCOPIC-ADD ON

## 2011-07-28 LAB — APTT: aPTT: 25

## 2011-07-28 LAB — HOMOCYSTEINE: Homocysteine: 6.7

## 2011-08-19 ENCOUNTER — Encounter: Payer: Self-pay | Admitting: Dietician

## 2013-04-20 ENCOUNTER — Other Ambulatory Visit: Payer: Self-pay

## 2014-05-07 ENCOUNTER — Inpatient Hospital Stay (HOSPITAL_COMMUNITY)
Admission: EM | Admit: 2014-05-07 | Discharge: 2014-05-11 | DRG: 065 | Disposition: A | Discharge status: Skilled Nursing Facility | Payer: Medicare Other | Attending: Internal Medicine | Admitting: Internal Medicine

## 2014-05-07 ENCOUNTER — Emergency Department (HOSPITAL_COMMUNITY): Payer: Medicare Other

## 2014-05-07 DIAGNOSIS — E119 Type 2 diabetes mellitus without complications: Secondary | ICD-10-CM | POA: Diagnosis present

## 2014-05-07 DIAGNOSIS — R32 Unspecified urinary incontinence: Secondary | ICD-10-CM | POA: Diagnosis present

## 2014-05-07 DIAGNOSIS — F17200 Nicotine dependence, unspecified, uncomplicated: Secondary | ICD-10-CM | POA: Diagnosis present

## 2014-05-07 DIAGNOSIS — N390 Urinary tract infection, site not specified: Secondary | ICD-10-CM | POA: Diagnosis present

## 2014-05-07 DIAGNOSIS — R1310 Dysphagia, unspecified: Secondary | ICD-10-CM | POA: Diagnosis present

## 2014-05-07 DIAGNOSIS — I6350 Cerebral infarction due to unspecified occlusion or stenosis of unspecified cerebral artery: Principal | ICD-10-CM | POA: Diagnosis present

## 2014-05-07 DIAGNOSIS — Z833 Family history of diabetes mellitus: Secondary | ICD-10-CM | POA: Diagnosis not present

## 2014-05-07 DIAGNOSIS — E785 Hyperlipidemia, unspecified: Secondary | ICD-10-CM | POA: Diagnosis present

## 2014-05-07 DIAGNOSIS — Z23 Encounter for immunization: Secondary | ICD-10-CM

## 2014-05-07 DIAGNOSIS — K5730 Diverticulosis of large intestine without perforation or abscess without bleeding: Secondary | ICD-10-CM

## 2014-05-07 DIAGNOSIS — I69959 Hemiplegia and hemiparesis following unspecified cerebrovascular disease affecting unspecified side: Secondary | ICD-10-CM | POA: Diagnosis not present

## 2014-05-07 DIAGNOSIS — F5232 Male orgasmic disorder: Secondary | ICD-10-CM

## 2014-05-07 DIAGNOSIS — R4789 Other speech disturbances: Secondary | ICD-10-CM | POA: Diagnosis present

## 2014-05-07 DIAGNOSIS — R4701 Aphasia: Secondary | ICD-10-CM | POA: Diagnosis present

## 2014-05-07 DIAGNOSIS — I639 Cerebral infarction, unspecified: Secondary | ICD-10-CM

## 2014-05-07 DIAGNOSIS — E7800 Pure hypercholesterolemia, unspecified: Secondary | ICD-10-CM | POA: Diagnosis present

## 2014-05-07 DIAGNOSIS — I69920 Aphasia following unspecified cerebrovascular disease: Secondary | ICD-10-CM

## 2014-05-07 DIAGNOSIS — I10 Essential (primary) hypertension: Secondary | ICD-10-CM | POA: Diagnosis present

## 2014-05-07 DIAGNOSIS — R1314 Dysphagia, pharyngoesophageal phase: Secondary | ICD-10-CM

## 2014-05-07 LAB — COMPREHENSIVE METABOLIC PANEL
ALBUMIN: 4.3 g/dL (ref 3.5–5.2)
ALK PHOS: 82 U/L (ref 39–117)
ALT: 9 U/L (ref 0–53)
AST: 13 U/L (ref 0–37)
Anion gap: 16 — ABNORMAL HIGH (ref 5–15)
BILIRUBIN TOTAL: 0.3 mg/dL (ref 0.3–1.2)
BUN: 12 mg/dL (ref 6–23)
CHLORIDE: 104 meq/L (ref 96–112)
CO2: 21 meq/L (ref 19–32)
Calcium: 9.4 mg/dL (ref 8.4–10.5)
Creatinine, Ser: 0.64 mg/dL (ref 0.50–1.35)
GFR calc Af Amer: >90 mL/min (ref >90)
Glucose, Bld: 100 mg/dL — ABNORMAL HIGH (ref 70–99)
POTASSIUM: 3.9 meq/L (ref 3.7–5.3)
Sodium: 141 mEq/L (ref 137–147)
Total Protein: 7.4 g/dL (ref 6.0–8.3)

## 2014-05-07 LAB — URINALYSIS, ROUTINE W REFLEX MICROSCOPIC
Glucose, UA: NEGATIVE mg/dL
KETONES UR: 15 mg/dL — AB
Leukocytes, UA: NEGATIVE
NITRITE: NEGATIVE
PROTEIN: 100 mg/dL — AB
Specific Gravity, Urine: 1.026 (ref 1.005–1.030)
UROBILINOGEN UA: 1 mg/dL (ref 0.0–1.0)
pH: 5.5 (ref 5.0–8.0)

## 2014-05-07 LAB — CBC WITH DIFFERENTIAL/PLATELET
BASOS ABS: 0 10*3/uL (ref 0.0–0.1)
BASOS PCT: 0 % (ref 0–1)
Eosinophils Absolute: 0 10*3/uL (ref 0.0–0.7)
Eosinophils Relative: 0 % (ref 0–5)
HCT: 41.5 % (ref 39.0–52.0)
HEMOGLOBIN: 14 g/dL (ref 13.0–17.0)
LYMPHS PCT: 9 % — AB (ref 12–46)
Lymphs Abs: 1 10*3/uL (ref 0.7–4.0)
MCH: 32 pg (ref 26.0–34.0)
MCHC: 33.7 g/dL (ref 30.0–36.0)
MCV: 94.7 fL (ref 78.0–100.0)
MONOS PCT: 5 % (ref 3–12)
Monocytes Absolute: 0.6 10*3/uL (ref 0.1–1.0)
NEUTROS ABS: 9.7 10*3/uL — AB (ref 1.7–7.7)
NEUTROS PCT: 86 % — AB (ref 43–77)
Platelets: 285 10*3/uL (ref 150–400)
RBC: 4.38 MIL/uL (ref 4.22–5.81)
RDW: 16.7 % — AB (ref 11.5–15.5)
WBC: 11.3 10*3/uL — AB (ref 4.0–10.5)

## 2014-05-07 LAB — TROPONIN I: Troponin I: <0.3 ng/mL (ref <0.30)

## 2014-05-07 LAB — RAPID URINE DRUG SCREEN, HOSP PERFORMED
AMPHETAMINES: NOT DETECTED
BARBITURATES: NOT DETECTED
BENZODIAZEPINES: NOT DETECTED
COCAINE: NOT DETECTED
Opiates: NOT DETECTED
TETRAHYDROCANNABINOL: NOT DETECTED

## 2014-05-07 LAB — URINE MICROSCOPIC-ADD ON

## 2014-05-07 LAB — APTT: APTT: 25 s (ref 24–37)

## 2014-05-07 LAB — ETHANOL: Alcohol, Ethyl (B): <11 mg/dL (ref 0–11)

## 2014-05-07 MED ORDER — STROKE: EARLY STAGES OF RECOVERY BOOK
Freq: Once | Status: AC
  Administered 2014-05-08: 02:00:00
  Filled 2014-05-07: qty 1

## 2014-05-07 MED ORDER — HYDRALAZINE HCL 20 MG/ML IJ SOLN
5.0000 mg | Freq: Once | INTRAMUSCULAR | Status: AC
  Administered 2014-05-07: 5 mg via INTRAVENOUS
  Filled 2014-05-07: qty 1

## 2014-05-07 MED ORDER — INSULIN ASPART 100 UNIT/ML ~~LOC~~ SOLN
0.0000 [IU] | SUBCUTANEOUS | Status: DC
  Administered 2014-05-08: 1 [IU] via SUBCUTANEOUS

## 2014-05-07 MED ORDER — RAMIPRIL 5 MG PO CAPS
10.0000 mg | ORAL_CAPSULE | Freq: Every day | ORAL | Status: DC
  Administered 2014-05-09 – 2014-05-11 (×3): 10 mg via ORAL
  Filled 2014-05-07 (×2): qty 1
  Filled 2014-05-07 (×3): qty 2

## 2014-05-07 MED ORDER — DEXTROSE 5 % IV SOLN
1.0000 g | INTRAVENOUS | Status: DC
  Administered 2014-05-08 (×2): 1 g via INTRAVENOUS
  Filled 2014-05-07 (×2): qty 10

## 2014-05-07 MED ORDER — SENNOSIDES-DOCUSATE SODIUM 8.6-50 MG PO TABS
1.0000 | ORAL_TABLET | Freq: Every evening | ORAL | Status: DC | PRN
  Administered 2014-05-09: 1 via ORAL
  Filled 2014-05-07: qty 1

## 2014-05-07 MED ORDER — SODIUM CHLORIDE 0.9 % IV BOLUS (SEPSIS)
500.0000 mL | Freq: Once | INTRAVENOUS | Status: AC
  Administered 2014-05-07: 500 mL via INTRAVENOUS

## 2014-05-07 MED ORDER — SIMVASTATIN 20 MG PO TABS
20.0000 mg | ORAL_TABLET | Freq: Every day | ORAL | Status: DC
  Administered 2014-05-08 – 2014-05-10 (×3): 20 mg via ORAL
  Filled 2014-05-07 (×3): qty 1

## 2014-05-07 MED ORDER — HEPARIN SODIUM (PORCINE) 5000 UNIT/ML IJ SOLN
5000.0000 [IU] | Freq: Three times a day (TID) | INTRAMUSCULAR | Status: DC
  Administered 2014-05-08 – 2014-05-11 (×11): 5000 [IU] via SUBCUTANEOUS
  Filled 2014-05-07 (×11): qty 1

## 2014-05-07 MED ORDER — SODIUM CHLORIDE 0.9 % IV SOLN
100.0000 mL/h | INTRAVENOUS | Status: DC
  Administered 2014-05-07: 100 mL/h via INTRAVENOUS

## 2014-05-07 NOTE — ED Notes (Signed)
Phlebotomy at the bedside  

## 2014-05-07 NOTE — H&P (Addendum)
Hospitalist Admission History and Physical  Patient name: Tony Johnson Medical record number: [id number] Date of birth: 1946-07-21 Age: 68 y.o. Gender: male  Primary Care Provider: No primary provider on file.  Chief Complaint: aphasia, UTI  History of Present Illness:This is a 68 y.o. year old male with significant past medical history of CVA with baseline L sided hemiparesis, type 2 DM, HLD presenting with aphasia. Pt states that he had worsening aphasia and difficulty speaking 2 days ago. Has also had worsening L sided weakness. Denies any CP, SOB, nausea vomiting. States that he has baseline urinary incontinence on a regular basis. This has also seemed to have worsened. Per report, pt was noted by friend with worsening sxs. EMS called.  On arrival to ER, afebrile. SBPs 150s-190s. Satting >96% on RA. WBC 11.3, Hgb 14, Cr 0.64, Glu 100. Head CT negative for any acute intracranial abnormalities. Stable R lacunar infarct. UA preliminarily not indicative of UTI.   Assessment and Plan: Tony Johnson is a 68 y.o. year old male presenting with aphasia, UTI  Active Problems:   Aphasia   1-Aphasia  - Proceed down stroke protocol -MRI, MRA, 2D ECHO, carotid dopplers, risk stratification labs  -previously on aggrenox- may need to escalate to plavix-defer to neuro  -PT/OT  -Also check ammonia level -f/u neuro recs   2-UTI -UA preliminarily negative for infection -HOWEVER--pt is markedly disshevled and unkempt with a mild leukocytosis and baseline clinical sxs of UTI -Panculture -Start on rocephin -Broad abx regimen if pt spikes fever fever overnight   3-DM -SSI, A1C  4-HTN -accelerated HTN on presentation -avoid > 25% variance from baseline as to avoid watershed event -cont home regimen -prn low dose hydralazine   FEN/GI: NPO  Prophylaxis: lovenox  Disposition: pending further evaluatiion  Code Status:Full Code    Patient Active Problem List   Diagnosis Date Noted  .  Aphasia 05/07/2014  . CIGARETTE SMOKER 09/22/2010  . DIVERTICULOSIS OF COLON 03/24/2010  . HYPERTENSION 09/11/2009  . DIABETES MELLITUS, TYPE II 09/11/2009  . CVA WITH LEFT HEMIPARESIS 04/18/2007  . HYPERCHOLESTEROLEMIA 12/09/2006  . ERECTILE DYSFUNCTION 12/09/2006   Past Medical History: Past Medical History  Diagnosis Date  . Diabetes mellitus   . Hypertension   . CVA (cerebral infarction)     hx of    Past Surgical History: Past Surgical History  Procedure Laterality Date  . Ganglion cyst excision      s/p removal - wrist in 1965- 07/12/04    Social History: History   Social History  . Marital Status: Legally Separated    Spouse Name: N/A    Number of Children: N/A  . Years of Education: N/A   Occupational History  . Not working     applied for diability   Social History Main Topics  . Smoking status: Current Every Day Smoker  . Smokeless tobacco: Not on file  . Alcohol Use: No  . Drug Use: Not on file  . Sexual Activity: Not on file   Other Topics Concern  . Not on file   Social History Narrative   Evacuee from [place]. Worked as Optician, dispensing. Lives by himself in an apartment. Quit 2 yrs ago but restarted.          Family History: Family History  Problem Relation Age of Onset  . Cancer Mother     breast cancer  . Diabetes Father     Allergies: No Known Allergies  Current Facility-Administered Medications  Medication Dose Route  Frequency Provider Last Rate Last Dose  .  stroke: mapping our early stages of recovery book   Does not apply Once Doree AlbeeSteven Auset Fritzler, MD      . 0.9 %  sodium chloride infusion  100 mL/hr Intravenous Continuous Gerhard Munchobert Lockwood, MD 100 mL/hr at 05/07/14 2100 100 mL/hr at 05/07/14 2100  . heparin injection 5,000 Units  5,000 Units Subcutaneous 3 times per day Doree AlbeeSteven Moustafa Mossa, MD      . Melene Muller[START ON 05/08/2014] insulin aspart (novoLOG) injection 0-9 Units  0-9 Units Subcutaneous 6 times per day Doree AlbeeSteven Corwyn Vora, MD      . Melene Muller[START ON  05/08/2014] ramipril (ALTACE) capsule 10 mg  10 mg Oral Daily Doree AlbeeSteven Jame Morrell, MD      . senna-docusate (Senokot-S) tablet 1 tablet  1 tablet Oral QHS PRN Doree AlbeeSteven Tomeshia Pizzi, MD      . Melene Muller[START ON 05/08/2014] simvastatin (ZOCOR) tablet 20 mg  20 mg Oral q1800 Doree AlbeeSteven Josalyn Dettmann, MD       Current Outpatient Prescriptions  Medication Sig Dispense Refill  . dipyridamole-aspirin (AGGRENOX) 200-25 MG per 12 hr capsule Take 1 capsule by mouth daily.      Marland Kitchen. ibuprofen (ADVIL,MOTRIN) 200 MG tablet Take 400 mg by mouth every 6 (six) hours as needed for headache or moderate pain.      . metFORMIN (GLUCOPHAGE) 1000 MG tablet Take 1,000 mg by mouth 2 (two) times daily with a meal.      . pravastatin (PRAVACHOL) 40 MG tablet Take 40 mg by mouth daily.        . ramipril (ALTACE) 10 MG capsule Take 10 mg by mouth daily.       Review Of Systems: 12 point ROS negative except as noted above in HPI.  Physical Exam: Filed Vitals:   05/07/14 2300  BP: 191/83  Pulse: 62  Temp:   Resp: 23    General: cooperative and markedly disshelved appearing, ambient ammonia odor , dysarthric  HEENT: PERRLA and extra ocular movement intact Heart: S1, S2 normal, no murmur, rub or gallop, regular rate and rhythm Lungs: clear to auscultation, no wheezes or rales and unlabored breathing Abdomen: abdomen is soft without significant tenderness, masses, organomegaly or guarding Extremities: extremities normal, atraumatic, no cyanosis or edema Skin:no rashes, no ecchymoses Neurology: significant L sided weakness, dysarthria  Labs and Imaging: Lab Results  Component Value Date/Time   NA 141 05/07/2014  7:41 PM   K 3.9 05/07/2014  7:41 PM   CL 104 05/07/2014  7:41 PM   CO2 21 05/07/2014  7:41 PM   BUN 12 05/07/2014  7:41 PM   CREATININE 0.64 05/07/2014  7:41 PM   GLUCOSE 100* 05/07/2014  7:41 PM   Lab Results  Component Value Date   WBC 11.3* 05/07/2014   HGB 14.0 05/07/2014   HCT 41.5 05/07/2014   MCV 94.7 05/07/2014   PLT 285 05/07/2014    Urinalysis    Component Value Date/Time   COLORURINE AMBER* 05/07/2014 2147   APPEARANCEUR CLEAR 05/07/2014 2147   LABSPEC 1.026 05/07/2014 2147   PHURINE 5.5 05/07/2014 2147   GLUCOSEU NEGATIVE 05/07/2014 2147   HGBUR SMALL* 05/07/2014 2147   BILIRUBINUR SMALL* 05/07/2014 2147   KETONESUR 15* 05/07/2014 2147   PROTEINUR 100* 05/07/2014 2147   UROBILINOGEN 1.0 05/07/2014 2147   NITRITE NEGATIVE 05/07/2014 2147   LEUKOCYTESUR NEGATIVE 05/07/2014 2147       Ct Head Wo Contrast  05/07/2014   CLINICAL DATA:  Slurred speech  EXAM: CT HEAD WITHOUT  CONTRAST  TECHNIQUE: Contiguous axial images were obtained from the base of the skull through the vertex without intravenous contrast.  COMPARISON:  07/12/2009  FINDINGS: No skull fracture is noted. Paranasal sinuses and mastoid air cells are unremarkable. Stable atrophy and chronic white matter disease. Stable lacunar infarcts in right parietal lobe and frontal lobe. A lacunar infarct in right internal capsule again noted. No intracranial hemorrhage, mass effect or midline shift. No definite acute cortical infarction. No mass lesion is noted on this unenhanced scan.  IMPRESSION: No acute intracranial abnormality. Stable atrophy and chronic white matter disease. Stable lacunar infarcts as described above. No definite acute cortical infarction.   Electronically Signed   By: Natasha Mead M.D.   On: 05/07/2014 20:05           Doree Albee MD  Pager: [phone]

## 2014-05-07 NOTE — ED Provider Notes (Signed)
CSN: [id number]     Arrival date & time 05/07/14  1846 History   First MD Initiated Contact with Patient 05/07/14 1904     Chief Complaint  Patient presents with  . Aphasia     (Consider location/radiation/quality/duration/timing/severity/associated sxs/prior Treatment) HPI Patient presents with concern weakness, speech difficulty. Patient states that his last normal time was approximately 34 hours ago. Since about the time he has had difficulty with speech, increased weakness beyond baseline left-sided hemiparesis. No new pain. No clear precipitating, alleviating, exacerbating factors. Patient's history is notable for prior stroke with persistent left-sided deficits.  Past Medical History  Diagnosis Date  . Diabetes mellitus   . Hypertension   . CVA (cerebral infarction)     hx of   Past Surgical History  Procedure Laterality Date  . Ganglion cyst excision      s/p removal - wrist in 1965- 07/12/04   Family History  Problem Relation Age of Onset  . Cancer Mother     breast cancer  . Diabetes Father    History  Substance Use Topics  . Smoking status: Current Every Day Smoker  . Smokeless tobacco: Not on file  . Alcohol Use: No    Review of Systems  Constitutional:       Per HPI, otherwise negative  HENT:       Per HPI, otherwise negative  Respiratory:       Per HPI, otherwise negative  Cardiovascular:       Per HPI, otherwise negative  Gastrointestinal: Negative for vomiting.  Endocrine:       Negative aside from HPI  Genitourinary:       Neg aside from HPI   Musculoskeletal:       Per HPI, otherwise negative  Skin: Negative.   Neurological: Positive for facial asymmetry, speech difficulty, weakness and numbness. Negative for syncope.      Allergies  Review of patient's allergies indicates no known allergies.  Home Medications   Prior to Admission medications   Medication Sig Start Date End Date Taking? Authorizing Provider  dipyridamole-aspirin  (AGGRENOX) 200-25 MG per 12 hr capsule Take 1 capsule by mouth daily.   Yes Historical Provider, MD  ibuprofen (ADVIL,MOTRIN) 200 MG tablet Take 400 mg by mouth every 6 (six) hours as needed for headache or moderate pain.   Yes Historical Provider, MD  metFORMIN (GLUCOPHAGE) 1000 MG tablet Take 1,000 mg by mouth 2 (two) times daily with a meal.   Yes Historical Provider, MD  pravastatin (PRAVACHOL) 40 MG tablet Take 40 mg by mouth daily.     Yes Historical Provider, MD  ramipril (ALTACE) 10 MG capsule Take 10 mg by mouth daily.   Yes Historical Provider, MD   BP 175/80  Temp(Src) 98.5 F (36.9 C) (Oral)  Resp 22  SpO2 98% Physical Exam  Nursing note and vitals reviewed. Constitutional: He is oriented to person, place, and time. He appears well-developed. He appears ill. No distress.  Odor of urine is immediately noticeable on arrival  HENT:  Head: Normocephalic and atraumatic.  Slight left facial droop  Eyes: Conjunctivae and EOM are normal.  Cardiovascular: Normal rate and regular rhythm.   Pulmonary/Chest: Effort normal. No stridor. No respiratory distress.  Abdominal: He exhibits no distension. There is no tenderness.  Musculoskeletal: He exhibits no edema.  Neurological: He is alert and oriented to person, place, and time. He displays atrophy. He displays no tremor. A cranial nerve deficit and sensory deficit is present. He exhibits  abnormal muscle tone. He displays no seizure activity.  Left-sided hemiparesis, and right lower extremity he is to/5 strength, right upper extremity is 3/5 strength.  Speech is slightly slurred.  Skin: Skin is warm and dry.  Psychiatric: He has a normal mood and affect.    ED Course  Procedures (including critical care time) Labs Review Labs Reviewed  CBC WITH DIFFERENTIAL - Abnormal; Notable for the following:    WBC 11.3 (*)    RDW 16.7 (*)    Neutrophils Relative % 86 (*)    Neutro Abs 9.7 (*)    Lymphocytes Relative 9 (*)    All other  components within normal limits  COMPREHENSIVE METABOLIC PANEL - Abnormal; Notable for the following:    Glucose, Bld 100 (*)    Anion gap 16 (*)    All other components within normal limits  URINALYSIS, ROUTINE W REFLEX MICROSCOPIC - Abnormal; Notable for the following:    Color, Urine AMBER (*)    Hgb urine dipstick SMALL (*)    Bilirubin Urine SMALL (*)    Ketones, ur 15 (*)    Protein, ur 100 (*)    All other components within normal limits  CULTURE, BLOOD (ROUTINE X 2)  CULTURE, BLOOD (ROUTINE X 2)  URINE CULTURE  APTT  ETHANOL  TROPONIN I  URINE RAPID DRUG SCREEN (HOSP PERFORMED)  URINE MICROSCOPIC-ADD ON  HEMOGLOBIN A1C  LIPID PANEL  CBC  CREATININE, SERUM    Imaging Review Ct Head Wo Contrast  05/07/2014   CLINICAL DATA:  Slurred speech  EXAM: CT HEAD WITHOUT CONTRAST  TECHNIQUE: Contiguous axial images were obtained from the base of the skull through the vertex without intravenous contrast.  COMPARISON:  07/12/2009  FINDINGS: No skull fracture is noted. Paranasal sinuses and mastoid air cells are unremarkable. Stable atrophy and chronic white matter disease. Stable lacunar infarcts in right parietal lobe and frontal lobe. A lacunar infarct in right internal capsule again noted. No intracranial hemorrhage, mass effect or midline shift. No definite acute cortical infarction. No mass lesion is noted on this unenhanced scan.  IMPRESSION: No acute intracranial abnormality. Stable atrophy and chronic white matter disease. Stable lacunar infarcts as described above. No definite acute cortical infarction.   Electronically Signed   By: Natasha MeadLiviu  Pop M.D.   On: 05/07/2014 20:05     EKG Interpretation   Date/Time:  Monday May 07 2014 19:08:53 EDT Ventricular Rate:  62 PR Interval:  231 QRS Duration: 90 QT Interval:  441 QTC Calculation: 448 R Axis:   35 Text Interpretation:  Sinus rhythm Prolonged PR interval Probable left  atrial enlargement Probable left ventricular  hypertrophy Anterior Q waves,  possibly due to LVH Sinus rhythm Artifact Left ventricular hypertrophy  lead migration from recent study - otherwise, no notable changes Abnormal  ekg Confirmed by Gerhard MunchLOCKWOOD, Jozalynn Noyce  MD [phone]) on 05/07/2014 7:33:35 PM       Cardiac monitor 60 sinus rhythm normal Pulse oximetry 99% room air normal  I discussed this case with both neurology and the hospitalist team.  On repeat exam the patient is comfortable, though he has failed his swallow study, he has no other new neurologic complaints MDM   Patient presents from home with greater than one day of a these, right-sided weakness.  Patient has multiple medical problems including prior stroke.  Given the patient's new neurologic complaints, though his initial exam is reassuring, he required admission for further evaluation and management.   Gerhard Munchobert Deah Ottaway, MD 05/07/14  2357 

## 2014-05-07 NOTE — ED Notes (Signed)
[facility], per report pt was reported to have worsening slurred speech, symptom onset noticed today by male friend of pt, last seen normal unknown time yesterday, pt hx of CVA x 5 yrs ago with baseline L sided deficits, pt A&O x 4, follows commands, speaks in complete sentences, pt c/o back pain, per friend pt is more weak on R side

## 2014-05-07 NOTE — ED Notes (Signed)
Discussed with Dr. Jeraldine LootsLockwood that patient has failed swallow screen. hospitalist and neuro have been consulted.

## 2014-05-07 NOTE — ED Notes (Signed)
Spoke to Daughter on phone, Townsend RogerFanta Hunter [phone].

## 2014-05-07 NOTE — ED Notes (Signed)
Called Dr. Alvester MorinNewton in regards to MRI stat.  MRI can be scheduled for first thing in the morning. Reported to radiology team.  Prepared for transport to X-ray.

## 2014-05-07 NOTE — ED Notes (Signed)
Dr. Alvester MorinNewton returns page, Reported bp 200/86, MD orders 5mg  of hydralazine IV one time and to recheck after giving.

## 2014-05-07 NOTE — ED Notes (Addendum)
Dr. Alvester MorinNewton paged in regards to BP.

## 2014-05-08 ENCOUNTER — Inpatient Hospital Stay (HOSPITAL_COMMUNITY): Payer: Medicare Other

## 2014-05-08 ENCOUNTER — Encounter (HOSPITAL_COMMUNITY): Payer: Self-pay | Admitting: *Deleted

## 2014-05-08 DIAGNOSIS — E7800 Pure hypercholesterolemia, unspecified: Secondary | ICD-10-CM

## 2014-05-08 DIAGNOSIS — I517 Cardiomegaly: Secondary | ICD-10-CM

## 2014-05-08 DIAGNOSIS — F17200 Nicotine dependence, unspecified, uncomplicated: Secondary | ICD-10-CM

## 2014-05-08 DIAGNOSIS — I69959 Hemiplegia and hemiparesis following unspecified cerebrovascular disease affecting unspecified side: Secondary | ICD-10-CM

## 2014-05-08 DIAGNOSIS — N390 Urinary tract infection, site not specified: Secondary | ICD-10-CM

## 2014-05-08 DIAGNOSIS — I6350 Cerebral infarction due to unspecified occlusion or stenosis of unspecified cerebral artery: Principal | ICD-10-CM

## 2014-05-08 DIAGNOSIS — R4701 Aphasia: Secondary | ICD-10-CM

## 2014-05-08 DIAGNOSIS — E119 Type 2 diabetes mellitus without complications: Secondary | ICD-10-CM

## 2014-05-08 LAB — CBC
HEMATOCRIT: 39.9 % (ref 39.0–52.0)
Hemoglobin: 13.6 g/dL (ref 13.0–17.0)
MCH: 31.5 pg (ref 26.0–34.0)
MCHC: 34.1 g/dL (ref 30.0–36.0)
MCV: 92.4 fL (ref 78.0–100.0)
PLATELETS: 296 10*3/uL (ref 150–400)
RBC: 4.32 MIL/uL (ref 4.22–5.81)
RDW: 17 % — AB (ref 11.5–15.5)
WBC: 10.7 10*3/uL — ABNORMAL HIGH (ref 4.0–10.5)

## 2014-05-08 LAB — LIPID PANEL
CHOL/HDL RATIO: 2.4 ratio
Cholesterol: 94 mg/dL (ref 0–200)
HDL: 39 mg/dL — AB (ref >39)
LDL CALC: 41 mg/dL (ref 0–99)
Triglycerides: 69 mg/dL (ref <150)
VLDL: 14 mg/dL (ref 0–40)

## 2014-05-08 LAB — URINALYSIS, ROUTINE W REFLEX MICROSCOPIC
Glucose, UA: NEGATIVE mg/dL
HGB URINE DIPSTICK: NEGATIVE
Ketones, ur: 15 mg/dL — AB
Leukocytes, UA: NEGATIVE
NITRITE: NEGATIVE
Protein, ur: NEGATIVE mg/dL
Specific Gravity, Urine: 1.021 (ref 1.005–1.030)
UROBILINOGEN UA: 1 mg/dL (ref 0.0–1.0)
pH: 7 (ref 5.0–8.0)

## 2014-05-08 LAB — HEMOGLOBIN A1C
Hgb A1c MFr Bld: 6.1 % — ABNORMAL HIGH (ref <5.7)
Mean Plasma Glucose: 128 mg/dL — ABNORMAL HIGH (ref <117)

## 2014-05-08 LAB — GLUCOSE, CAPILLARY
GLUCOSE-CAPILLARY: 85 mg/dL (ref 70–99)
GLUCOSE-CAPILLARY: 92 mg/dL (ref 70–99)
GLUCOSE-CAPILLARY: 114 mg/dL — AB (ref 70–99)
Glucose-Capillary: 125 mg/dL — ABNORMAL HIGH (ref 70–99)

## 2014-05-08 LAB — CBG MONITORING, ED: GLUCOSE-CAPILLARY: 109 mg/dL — AB (ref 70–99)

## 2014-05-08 LAB — CREATININE, SERUM
Creatinine, Ser: 0.65 mg/dL (ref 0.50–1.35)
GFR calc Af Amer: >90 mL/min (ref >90)
GFR calc non Af Amer: >90 mL/min (ref >90)

## 2014-05-08 LAB — AMMONIA: Ammonia: 28 umol/L (ref 11–60)

## 2014-05-08 MED ORDER — SODIUM CHLORIDE 0.9 % IV SOLN
INTRAVENOUS | Status: DC
Start: 2014-05-08 — End: 2014-05-11
  Administered 2014-05-08: 12:00:00 via INTRAVENOUS

## 2014-05-08 MED ORDER — HYDRALAZINE HCL 20 MG/ML IJ SOLN
5.0000 mg | Freq: Once | INTRAMUSCULAR | Status: AC
  Administered 2014-05-08: 5 mg via INTRAVENOUS
  Filled 2014-05-08: qty 1

## 2014-05-08 MED ORDER — PNEUMOCOCCAL VAC POLYVALENT 25 MCG/0.5ML IJ INJ
0.5000 mL | INJECTION | INTRAMUSCULAR | Status: AC
  Administered 2014-05-09: 0.5 mL via INTRAMUSCULAR
  Filled 2014-05-08: qty 0.5

## 2014-05-08 MED ORDER — HYDRALAZINE HCL 20 MG/ML IJ SOLN
10.0000 mg | Freq: Four times a day (QID) | INTRAMUSCULAR | Status: DC | PRN
  Administered 2014-05-08 – 2014-05-10 (×3): 10 mg via INTRAVENOUS
  Filled 2014-05-08 (×3): qty 1

## 2014-05-08 MED ORDER — INSULIN ASPART 100 UNIT/ML ~~LOC~~ SOLN
0.0000 [IU] | Freq: Three times a day (TID) | SUBCUTANEOUS | Status: DC
  Administered 2014-05-09: 1 [IU] via SUBCUTANEOUS
  Administered 2014-05-11: 2 [IU] via SUBCUTANEOUS

## 2014-05-08 MED ORDER — ASPIRIN 300 MG RE SUPP
300.0000 mg | Freq: Every day | RECTAL | Status: DC
  Administered 2014-05-08: 300 mg via RECTAL
  Filled 2014-05-08: qty 1

## 2014-05-08 NOTE — Evaluation (Signed)
Clinical/Bedside Swallow Evaluation Patient Details  Name: Tony Johnson MRN: [id number] Date of Birth: November 27, 1945  Today's Date: 05/08/2014 Time: 1106-1126 SLP Time Calculation (min): 20 min  Past Medical History:  Past Medical History  Diagnosis Date  . Diabetes mellitus   . Hypertension   . CVA (cerebral infarction)     hx of   Past Surgical History:  Past Surgical History  Procedure Laterality Date  . Ganglion cyst excision      s/p removal - wrist in 1965- 07/12/04   HPI:  Pt is a 68 yo male presenting with aphasia and confusion. Admitted 7/27. MRI + for acute L pontine infarct. Pt with h/o of previous strokes with residual L spastic hemiplegia.    Assessment / Plan / Recommendation Clinical Impression  Pt presents with mild oral holding and suspected delayed swallow, which result in inability to adequately protect his airway with thin liquids, as evidenced by delayed and immediate coughing, indicative of aspiration/penetration. Despite signs of dysphagia, pt appeared to adequately protect his airway with nectar thick liquids and solid PO. Pt exhibited prolonged mastication and weak lingual manipulation with Dys 3 textures, with Min cues provided by SLP for use of a liquid wash to facilitate clearace of residuals. Recommend to initiate trial of Dys 2 textures and nectar thick liquids with SLP f/u for tolerance and readiness to advance.    Aspiration Risk  Moderate    Diet Recommendation Dysphagia 2 (Fine chop);Nectar-thick liquid   Liquid Administration via: Cup;No straw Medication Administration: Whole meds with puree Supervision: Patient able to self feed;Full supervision/cueing for compensatory strategies Compensations: Slow rate;Small sips/bites;Check for pocketing Postural Changes and/or Swallow Maneuvers: Seated upright 90 degrees;Upright 30-60 min after meal    Other  Recommendations Oral Care Recommendations: Oral care BID Other Recommendations: Order thickener  from pharmacy;Prohibited food (jello, ice cream, thin soups);Remove water pitcher   Follow Up Recommendations  Skilled Nursing facility;24 hour supervision/assistance    Frequency and Duration min 2x/week  2 weeks   Pertinent Vitals/Pain n/a    SLP Swallow Goals     Swallow Study Prior Functional Status       General Date of Onset: 05/07/14 (pt reports symptoms started over the last few days) HPI: Pt is a 68 yo male presenting with aphasia and confusion. Admitted 7/27. MRI + for acute L pontine infarct. Pt with h/o of previous strokes with residual L spastic hemiplegia.  Type of Study: Bedside swallow evaluation Previous Swallow Assessment: none in chart, but pt reports a bedside swallow eval with previous CVA, at which time he was cleared to start a regular diet and thin liquids, denies any h/o dysphagia Diet Prior to this Study: NPO Temperature Spikes Noted: No Respiratory Status: Room air History of Recent Intubation: No Behavior/Cognition: Alert;Cooperative;Pleasant mood Oral Cavity - Dentition: Missing dentition;Poor condition Self-Feeding Abilities: Able to feed self;Needs assist Patient Positioning: Upright in chair Baseline Vocal Quality: Wet Volitional Cough: Weak (reflexive cough is strong) Volitional Swallow: Able to elicit    Oral/Motor/Sensory Function Overall Oral Motor/Sensory Function: Impaired Labial ROM: Within Functional Limits Labial Symmetry: Within Functional Limits Labial Strength: Reduced Labial Sensation: Within Functional Limits Lingual ROM: Within Functional Limits Lingual Symmetry: Within Functional Limits Lingual Strength: Reduced Lingual Sensation: Within Functional Limits Facial ROM: Within Functional Limits Facial Symmetry: Within Functional Limits Velum: Within Functional Limits Mandible: Within Functional Limits   Ice Chips Ice chips: Impaired Presentation: Spoon Pharyngeal Phase Impairments: Suspected delayed Swallow   Thin Liquid  Thin Liquid: Impaired  Presentation: Cup;Self Fed;Straw Oral Phase Functional Implications: Oral holding Pharyngeal  Phase Impairments: Suspected delayed Swallow;Wet Vocal Quality;Cough - Immediate;Cough - Delayed    Nectar Thick Nectar Thick Liquid: Impaired Presentation: Cup;Self Fed Oral phase functional implications: Oral holding Pharyngeal Phase Impairments: Suspected delayed Swallow   Honey Thick Honey Thick Liquid: Not tested   Puree Puree: Impaired Presentation: Self Fed;Spoon Oral Phase Functional Implications: Oral holding Pharyngeal Phase Impairments: Suspected delayed Swallow   Solid   GO    Solid: Impaired Presentation: Self Fed Oral Phase Impairments: Impaired mastication;Impaired anterior to posterior transit;Reduced lingual movement/coordination Oral Phase Functional Implications: Oral residue Pharyngeal Phase Impairments: Suspected delayed Swallow      Maxcine Ham, M.A. CCC-SLP [phone]  Maxcine Ham 05/08/2014,12:17 PM

## 2014-05-08 NOTE — Progress Notes (Signed)
  Echocardiogram 2D Echocardiogram has been performed.  Tony Johnson, Tony Johnson 05/08/2014, 2:46 PM

## 2014-05-08 NOTE — ED Notes (Signed)
Dr. Rudolpho SevinKirkkpatrick at the bedside.

## 2014-05-08 NOTE — ED Notes (Signed)
Spoke to Dr. Alvester MorinNewton in regards to BP reading still over 200.  MD gives verbal order for another 5mg  dose of hydralazine. Also informed that Dr. Amada JupiterKirkpatrick has been updated.

## 2014-05-08 NOTE — ED Notes (Signed)
Reported to Dr. Amada JupiterKirkpatrick that the patient has had MRA changed to be completed in the morning. He is in agreement with plan of care.  Also reviewed current BP over 200 with recent hydralazine given for management.  No new orders at this time.

## 2014-05-08 NOTE — ED Notes (Signed)
Dr. Alvester MorinNewton paged.

## 2014-05-08 NOTE — Progress Notes (Signed)
Nutrition Brief Note  Patient identified on the Malnutrition Screening Tool (MST) Report  Wt Readings from Last 15 Encounters:  05/08/14 193 lb 1.6 oz (87.59 kg)  09/22/10 209 lb (94.802 kg)  03/24/10 206 lb 8 oz (93.668 kg)  12/10/09 207 lb 4.8 oz (94.031 kg)  10/29/09 214 lb 9.6 oz (97.342 kg)  09/11/09 210 lb 12.8 oz (95.618 kg)  08/08/09 208 lb 14.4 oz (94.756 kg)    Body mass index is 25.48 kg/(m^2). Patient meets criteria for Overweight based on current BMI. Pt denies any recent weight loss stating that he has been maintaining his weight at 190 lbs.   Current diet order is Dysphagia 2 with nectar-thick liquids. Diet was advanced today at noon. Pt states his appetite has been very good and he expects to eat great while admitted.   Labs and medications reviewed.   No nutrition interventions warranted at this time. If nutrition issues arise, please consult RD.   Ian Malkineanne Barnett RD, LDN Inpatient Clinical Dietitian Pager: [phone] After Hours Pager: [phone]

## 2014-05-08 NOTE — Evaluation (Signed)
Physical Therapy Evaluation Patient Details Name: Tony GiovanniDarrell W Johnson MRN: [id number] DOB: 22-Apr-1946 Today's Date: 05/08/2014   History of Present Illness  Pt is a 68 yo male presenting with aphasia and confusion. Admitted 7/27. MRI + for acute L pontine infarct. Pt with h/o of previous strokes with residual L spastic hemiplegia.  Clinical Impression  Pt more emotional this date but pt is aware. Pt with further impaired L UE/LE strength and now R LE weakness compared to PTA. Pt was mod I with hemi-walker PTA however now requires assistx2 for all mobility/transfers. Pt to benefit from CIR however does not have supportive staff at home to provide 24/7 assist. Pt will need ST-SNF to allow for longer rehab to achieve safe mod I function prior to transitioning home.    Follow Up Recommendations SNF;Supervision/Assistance - 24 hour    Equipment Recommendations  None recommended by PT    Recommendations for Other Services       Precautions / Restrictions Precautions Precautions: Fall Restrictions Weight Bearing Restrictions: No      Mobility  Bed Mobility Overal bed mobility: Needs Assistance Bed Mobility: Rolling;Sidelying to Sit Rolling: Max assist Sidelying to sit: Max assist       General bed mobility comments: max directional v/c's, pt able to use R UE to assist with transfer but limited with bilat LE despite max v/c's. maxA for trunk elevation  Transfers Overall transfer level: Needs assistance Equipment used:  (2 person forward lift with gait belt) Transfers: Sit to/from UGI CorporationStand;Stand Pivot Transfers Sit to Stand: Max assist;+2 physical assistance Stand pivot transfers: Max assist;+2 physical assistance       General transfer comment: pt initiated anterior weight shift but required maxA to raise bottom up off bed, max v/c's to straighten R knee out. Pt unable to initiate stepping sequencing to pvt to chair  Ambulation/Gait                Stairs             Wheelchair Mobility    Modified Rankin (Stroke Patients Only) Modified Rankin (Stroke Patients Only) Pre-Morbid Rankin Score: Moderately severe disability Modified Rankin: Severe disability     Balance Overall balance assessment: Needs assistance Sitting-balance support: Feet supported;Single extremity supported Sitting balance-Leahy Scale: Poor Sitting balance - Comments: pt unable to maintain balance > 10 sec, + retropulsion and L lateral lean   Standing balance support: Single extremity supported Standing balance-Leahy Scale: Poor Standing balance comment: requires assist to maintain standing posture                             Pertinent Vitals/Pain Denies pain    Home Living Family/patient expects to be discharged to:: Group home                 Additional Comments: pt reports he rents a room out.    Prior Function Level of Independence: Independent with assistive device(s)         Comments: pt reports amb with hemi-walker and performing dressing/bathing mod I. He reports he has a walk in shower with seat and owner is buying a hand held shower. pt reports they have supportive staff to make meals and clean but no one can help him with transfers or ADLs.     Hand Dominance   Dominant Hand: Right    Extremity/Trunk Assessment   Upper Extremity Assessment: LUE deficits/detail       LUE Deficits /  Details: spastic hemiplegia, increased flexor tone   Lower Extremity Assessment: LLE deficits/detail   LLE Deficits / Details: grossly 1/5  Cervical / Trunk Assessment: Normal  Communication   Communication:  (soft, spoken, slurred but able to understand pt)  Cognition Arousal/Alertness: Awake/alert Behavior During Therapy: WFL for tasks assessed/performed Overall Cognitive Status: Within Functional Limits for tasks assessed                      General Comments      Exercises        Assessment/Plan    PT Assessment  Patient needs continued PT services  PT Diagnosis Difficulty walking;Generalized weakness;Hemiplegia non-dominant side   PT Problem List Decreased strength;Decreased activity tolerance;Decreased balance;Decreased coordination;Decreased mobility  PT Treatment Interventions DME instruction;Gait training;Functional mobility training;Therapeutic activities;Therapeutic exercise;Balance training;Neuromuscular re-education   PT Goals (Current goals can be found in the Care Plan section) Acute Rehab PT Goals Patient Stated Goal: home PT Goal Formulation: With patient Time For Goal Achievement: 05/22/14 Potential to Achieve Goals: Good    Frequency Min 4X/week   Barriers to discharge Decreased caregiver support      Co-evaluation               End of Session Equipment Utilized During Treatment: Gait belt Activity Tolerance: Patient tolerated treatment well Patient left: in chair;with call bell/phone within reach;with chair alarm set Nurse Communication: Mobility status (2 person std pvt)         Time: 1610-9604 PT Time Calculation (min): 23 min   Charges:   PT Evaluation $Initial PT Evaluation Tier I: 1 Procedure PT Treatments $Therapeutic Activity: 8-22 mins   PT G CodesMarcene Brawn 05/08/2014, 10:24 AM  Lewis Shock, PT, DPT Pager #: [phone] Office #: [phone]

## 2014-05-08 NOTE — Evaluation (Signed)
Speech Language Pathology Evaluation Patient Details Name: Angelene GiovanniDarrell W Greenman MRN: [id number] DOB: 1945/12/09 Today's Date: 05/08/2014 Time: [id number] SLP Time Calculation (min): 20 min  Problem List:  Patient Active Problem List   Diagnosis Date Noted  . CVA (cerebral infarction) 05/08/2014  . Aphasia 05/07/2014  . UTI (lower urinary tract infection) 05/07/2014  . CIGARETTE SMOKER 09/22/2010  . DIVERTICULOSIS OF COLON 03/24/2010  . HYPERTENSION 09/11/2009  . DIABETES MELLITUS, TYPE II 09/11/2009  . CVA WITH LEFT HEMIPARESIS 04/18/2007  . HYPERCHOLESTEROLEMIA 12/09/2006  . ERECTILE DYSFUNCTION 12/09/2006   Past Medical History:  Past Medical History  Diagnosis Date  . Diabetes mellitus   . Hypertension   . CVA (cerebral infarction)     hx of   Past Surgical History:  Past Surgical History  Procedure Laterality Date  . Ganglion cyst excision      s/p removal - wrist in 1965- 07/12/04   HPI:  Pt is a 68 yo male presenting with aphasia and confusion. Admitted 7/27. MRI + for acute L pontine infarct. Pt with h/o of previous strokes with residual L spastic hemiplegia.    Assessment / Plan / Recommendation Clinical Impression  Pt presents with moderate dysarthria which is exacerbated from his baseline per his report. Pt also exhibits decreased retrieval of new information. He will benefit from skilled SLP services to maximize functional communication and memory.    SLP Assessment  Patient needs continued Speech Lanaguage Pathology Services    Follow Up Recommendations  Skilled Nursing facility;24 hour supervision/assistance    Frequency and Duration min 2x/week  2 weeks   Pertinent Vitals/Pain n/a   SLP Goals  SLP Goals Potential to Achieve Goals: Good Potential Considerations: Ability to learn/carryover information;Previous level of function  SLP Evaluation Prior Functioning  Cognitive/Linguistic Baseline: Baseline deficits Baseline deficit details: patient reports  mild slurred speech at baseline Type of Home: Other(Comment) (boarding house)   Cognition  Overall Cognitive Status: Impaired/Different from baseline Arousal/Alertness: Awake/alert Orientation Level: Oriented X4 Attention: Sustained Sustained Attention: Appears intact Memory: Impaired Memory Impairment: Decreased recall of new information;Retrieval deficit Awareness: Appears intact Problem Solving: Appears intact (with basic problem solving) Behaviors: Lability Safety/Judgment: Appears intact    Comprehension  Auditory Comprehension Overall Auditory Comprehension: Appears within functional limits for tasks assessed Visual Recognition/Discrimination Discrimination: Not tested Reading Comprehension Reading Status: Not tested    Expression Expression Primary Mode of Expression: Verbal Verbal Expression Overall Verbal Expression: Appears within functional limits for tasks assessed Written Expression Dominant Hand: Right Written Expression: Not tested   Oral / Motor Oral Motor/Sensory Function Overall Oral Motor/Sensory Function: Impaired Labial ROM: Within Functional Limits Labial Symmetry: Within Functional Limits Labial Strength: Reduced Labial Sensation: Within Functional Limits Lingual ROM: Within Functional Limits Lingual Symmetry: Within Functional Limits Lingual Strength: Reduced Lingual Sensation: Within Functional Limits Facial ROM: Within Functional Limits Facial Symmetry: Within Functional Limits Velum: Within Functional Limits Mandible: Within Functional Limits Motor Speech Overall Motor Speech: Impaired Respiration: Within functional limits Phonation: Low vocal intensity;Wet Resonance: Within functional limits Articulation: Impaired Level of Impairment: Phrase Intelligibility: Intelligibility reduced Phrase: 50-74% accurate Sentence: 50-74% accurate Conversation: 50-74% accurate Motor Planning: Witnin functional limits Motor Speech Errors: Not  applicable Interfering Components: Premorbid status;Inadequate dentition   GO      Maxcine HamLaura Paiewonsky, M.A. CCC-SLP [phone]  Maxcine Hamaiewonsky, Gretchen Weinfeld 05/08/2014, 12:27 PM

## 2014-05-08 NOTE — Evaluation (Signed)
SLP Cancellation Note  Patient Details Name: Tony Johnson MRN: [id number] DOB: February 01, 1946   Cancelled treatment:       Reason Eval/Treat Not Completed: Patient at procedure or test/unavailable (will reattempt evaluation at another time)   Donavan Burnetamara Mackenzy Eisenberg, MS [facility] SLP [phone]

## 2014-05-08 NOTE — ED Notes (Signed)
Neurology paged

## 2014-05-08 NOTE — Progress Notes (Addendum)
Patient ID: Tony Johnson  male  ZOX:[id number]    DOB: 1946/02/26    DOA: 05/07/2014  PCP: No primary provider on file.  Assessment/Plan: Principal Problem:   CVA (cerebral infarction): Acute with prior history of CVA and residual left hemiparesis, admitted with increased slurred speech, confusion, right side weaker - MRI of the brain positive for acute left pontine infarct - 2-D echo, carotid Dopplers pending - Currently working with speech therapist for follow evaluation - Obtain lipid panel, including A1c, PT, OT - Currently n.p.o. we'll place on aspirin PR until able to take by mouth, was on Aggrenox prior to admission  Active Problems:   HYPERCHOLESTEROLEMIA Currently n.p.o., we'll place on statins once tolerating oral     malignant hypertension   - Placed on IV hydralazine with parameters, currently n.p.o.    history of  CVA WITH LEFT HEMIPARESIS    DIABETES MELLITUS, TYPE II -Continue sliding scale insulin     UTI (lower urinary tract infection) Repeat UA and culture, for now Continue IV Rocephin , if UA negative, will DC antibiotics   DVT Prophylaxis: heparin subcutaneous   Code Status: full code   Family Communication:  Disposition:  Consultants:  Neurology   Procedures:  MRI/MRA   Antibiotics:  IV Rocephin     Subjective: Patient seen and examined, somewhat tearful upon hearing about the new stroke. Working with speech therapist   Objective: Weight change:   Intake/Output Summary (Last 24 hours) at 05/08/14 1157 Last data filed at 05/08/14 0059  Gross per 24 hour  Intake   1000 ml  Output    100 ml  Net    900 ml   Blood pressure 203/81, pulse 57, temperature 98.1 F (36.7 C), temperature source Oral, resp. rate 18, height 6\' 1"  (1.854 m), weight 87.59 kg (193 lb 1.6 oz), SpO2 98.00%.  Physical Exam: General: Alert and awake, oriented x3, not in any acute distress. CVS: S1-S2 clear, no murmur rubs or gallops Chest: clear to  auscultation bilaterally, no wheezing, rales or rhonchi Abdomen: soft nontender, nondistended, normal bowel sounds  Extremities: no cyanosis, clubbing or edema noted bilaterally Neuro: 4/5 right side, hemiparesis on the left side   Lab Results: Basic Metabolic Panel:  Recent Labs Lab 05/07/14 1941 05/07/14 2352  NA 141  --   K 3.9  --   CL 104  --   CO2 21  --   GLUCOSE 100*  --   BUN 12  --   CREATININE 0.64 0.65  CALCIUM 9.4  --    Liver Function Tests:  Recent Labs Lab 05/07/14 1941  AST 13  ALT 9  ALKPHOS 82  BILITOT 0.3  PROT 7.4  ALBUMIN 4.3   No results found for this basename: LIPASE, AMYLASE,  in the last 168 hours  Recent Labs Lab 05/07/14 2358  AMMONIA 28   CBC:  Recent Labs Lab 05/07/14 1941 05/07/14 2352  WBC 11.3* 10.7*  NEUTROABS 9.7*  --   HGB 14.0 13.6  HCT 41.5 39.9  MCV 94.7 92.4  PLT 285 296   Cardiac Enzymes:  Recent Labs Lab 05/07/14 1941  TROPONINI <0.30   BNP: No components found with this basename: POCBNP,  CBG:  Recent Labs Lab 05/08/14 0057 05/08/14 0434 05/08/14 1013  GLUCAP 109* 85 92     Micro Results: No results found for this or any previous visit (from the past 240 hour(s)).  Studies/Results: Dg Chest 2 View  05/08/2014   CLINICAL  DATA:  Stroke today.  Left-sided weakness.  EXAM: CHEST  2 VIEW  COMPARISON:  07/12/2009  FINDINGS: The heart size and mediastinal contours are within normal limits. Both lungs are clear. The visualized skeletal structures are unremarkable.  IMPRESSION: Normal chest.   Electronically Signed   By: Geanie Cooley M.D.   On: 05/08/2014 01:26   Ct Head Wo Contrast  05/07/2014   CLINICAL DATA:  Slurred speech  EXAM: CT HEAD WITHOUT CONTRAST  TECHNIQUE: Contiguous axial images were obtained from the base of the skull through the vertex without intravenous contrast.  COMPARISON:  07/12/2009  FINDINGS: No skull fracture is noted. Paranasal sinuses and mastoid air cells are unremarkable.  Stable atrophy and chronic white matter disease. Stable lacunar infarcts in right parietal lobe and frontal lobe. A lacunar infarct in right internal capsule again noted. No intracranial hemorrhage, mass effect or midline shift. No definite acute cortical infarction. No mass lesion is noted on this unenhanced scan.  IMPRESSION: No acute intracranial abnormality. Stable atrophy and chronic white matter disease. Stable lacunar infarcts as described above. No definite acute cortical infarction.   Electronically Signed   By: Natasha Mead M.D.   On: 05/07/2014 20:05   Mr Brain Wo Contrast  05/08/2014   CLINICAL DATA:  68 year old male with diabetes and high blood pressure and prior stroke presenting with 2 day history of increased slurred speech and mild confusion.  EXAM: MRI HEAD WITHOUT CONTRAST  MRA HEAD WITHOUT CONTRAST  TECHNIQUE: Multiplanar, multiecho pulse sequences of the brain and surrounding structures were obtained without intravenous contrast. Angiographic images of the head were obtained using MRA technique without contrast.  COMPARISON:  05/07/2014 CT.  04/21/2007 MR.  FINDINGS: MRI HEAD FINDINGS  Acute nonhemorrhagic left pontine infarct.  Remote posterior right operculum region, posterior right frontal lobe and right parietal lobe infarcts. Remote infarct posterior limb right internal capsule, posterior right corona radiata and right thalamus with Wallerian degeneration and small right cerebral peduncle. Minimal amount of blood breakdown products associated with the remote right hemispheric infarcts (and along the periphery of the right frontal -temporal lobe). Subsequent mild dilation right lateral ventricle.  Remote anterior inferior left frontal lobe infarct and encephalomalacia.  Global atrophy with ventricular prominence felt to be related to atrophy and remote infarcts.  No intracranial mass lesion noted on this unenhanced exam.  Partial opacification anterior superior ethmoid sinus air cells.  Minimal mucosal thickening remainder ethmoid sinus air cells and maxillary sinuses.  Cervical medullary junction, pituitary region, pineal region and orbital structures unremarkable.  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 The slightly decreased caliber of the right internal carotid artery may be partially explained by this configuration although result of decreased intracranial flow from prior infarcts or proximal stenosis not excluded.  Mild ectasia of the left internal carotid artery proximal to the skullbase.  Moderate to marked narrowing distal M1 segment right middle cerebral artery extending to involve the bifurcation. Mild moderate right middle cerebral artery branch vessel narrowing and irregularity.  Moderate narrowing distal M1 segment left middle cerebral artery. Marked narrowing left middle cerebral artery bifurcation. Mild to moderate narrowing left middle cerebral artery branch vessels.  Small caliber and irregularity of the A2 segment of the right anterior cerebral artery.  Ectatic vertebral arteries. Moderate to marked narrowing distal left vertebral artery.  Nonvisualized right posterior inferior cerebellar artery.  Moderate to slightly marked narrowing and irregularity proximal to mid aspect of the basilar artery.  Nonvisualized anterior  inferior cerebellar arteries.  Irregular narrowed left superior cerebellar artery.  Mild to slightly moderate tandem stenosis posterior cerebral artery more notable on the right and involving distal branches.  No aneurysm identified.  IMPRESSION: Acute nonhemorrhagic left pontine infarct.  Remote infarcts, atrophy, paranasal sinus mucosal thickening and intracranial atherosclerotic type changes as detailed above.   Electronically Signed   By: Bridgett Larsson M.D.   On: 05/08/2014 09:26   Mr Maxine Glenn Head/brain Wo Cm  05/08/2014   CLINICAL DATA:  68 year old male with diabetes and high blood pressure and prior stroke presenting with 2 day  history of increased slurred speech and mild confusion.  EXAM: MRI HEAD WITHOUT CONTRAST  MRA HEAD WITHOUT CONTRAST  TECHNIQUE: Multiplanar, multiecho pulse sequences of the brain and surrounding structures were obtained without intravenous contrast. Angiographic images of the head were obtained using MRA technique without contrast.  COMPARISON:  05/07/2014 CT.  04/21/2007 MR.  FINDINGS: MRI HEAD FINDINGS  Acute nonhemorrhagic left pontine infarct.  Remote posterior right operculum region, posterior right frontal lobe and right parietal lobe infarcts. Remote infarct posterior limb right internal capsule, posterior right corona radiata and right thalamus with Wallerian degeneration and small right cerebral peduncle. Minimal amount of blood breakdown products associated with the remote right hemispheric infarcts (and along the periphery of the right frontal -temporal lobe). Subsequent mild dilation right lateral ventricle.  Remote anterior inferior left frontal lobe infarct and encephalomalacia.  Global atrophy with ventricular prominence felt to be related to atrophy and remote infarcts.  No intracranial mass lesion noted on this unenhanced exam.  Partial opacification anterior superior ethmoid sinus air cells. Minimal mucosal thickening remainder ethmoid sinus air cells and maxillary sinuses.  Cervical medullary junction, pituitary region, pineal region and orbital structures unremarkable.  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 The slightly decreased caliber of the right internal carotid artery may be partially explained by this configuration although result of decreased intracranial flow from prior infarcts or proximal stenosis not excluded.  Mild ectasia of the left internal carotid artery proximal to the skullbase.  Moderate to marked narrowing distal M1 segment right middle cerebral artery extending to involve the bifurcation. Mild moderate right middle cerebral artery branch  vessel narrowing and irregularity.  Moderate narrowing distal M1 segment left middle cerebral artery. Marked narrowing left middle cerebral artery bifurcation. Mild to moderate narrowing left middle cerebral artery branch vessels.  Small caliber and irregularity of the A2 segment of the right anterior cerebral artery.  Ectatic vertebral arteries. Moderate to marked narrowing distal left vertebral artery.  Nonvisualized right posterior inferior cerebellar artery.  Moderate to slightly marked narrowing and irregularity proximal to mid aspect of the basilar artery.  Nonvisualized anterior inferior cerebellar arteries.  Irregular narrowed left superior cerebellar artery.  Mild to slightly moderate tandem stenosis posterior cerebral artery more notable on the right and involving distal branches.  No aneurysm identified.  IMPRESSION: Acute nonhemorrhagic left pontine infarct.  Remote infarcts, atrophy, paranasal sinus mucosal thickening and intracranial atherosclerotic type changes as detailed above.   Electronically Signed   By: Bridgett Larsson M.D.   On: 05/08/2014 09:26    Medications: Scheduled Meds: . cefTRIAXone (ROCEPHIN)  IV  1 g Intravenous Q24H  . heparin  5,000 Units Subcutaneous 3 times per day  . insulin aspart  0-9 Units Subcutaneous 6 times per day  . [START ON 05/09/2014] pneumococcal 23 valent vaccine  0.5 mL Intramuscular Tomorrow-1000  . ramipril  10 mg Oral Daily  .  simvastatin  20 mg Oral q1800      LOS: 1 day   Nochum Fenter M.D. [facility] 05/08/2014, 11:57 AM Pager: [phone]  If 7PM-7AM, please contact night-coverage [email or web address] Password [email or web address]  **Disclaimer: This note was dictated with voice recognition software. Similar sounding words can inadvertently be transcribed and this note may contain transcription errors which may not have been corrected upon publication of note.**

## 2014-05-08 NOTE — Progress Notes (Signed)
CARE MANAGEMENT NOTE 05/08/2014  Patient:  Tony Johnson,Tony Johnson   Account Number:  [id number]  Date Initiated:  05/08/2014  Documentation initiated by:  Jiles CrockerHANDLER,Izzabell Klasen  Subjective/Objective Assessment:   ADMITTED FOR CVA     Action/Plan:   CM FOLLOWING FOR DCP   Anticipated DC Date:  05/12/2014   Anticipated DC Plan:  POSSIBLE SNF PLACEMENT     DC Planning Services  CM consult/ SW CONSULT         Status of service:  In process, will continue to follow Medicare Important Message given?   (If response is "NO", the following Medicare IM given date fields will be blank)  Per UR Regulation:  Reviewed for med. necessity/level of care/duration of stay  Comments:  7/28/2015[name]-  B Tony Betley RN,BSN,MHA [phone]

## 2014-05-08 NOTE — Progress Notes (Signed)
*  PRELIMINARY RESULTS* Vascular Ultrasound Carotid Duplex (Doppler) has been completed.   Findings suggest 1-39% internal carotid artery stenosis bilaterally. Vertebral arteries are patent with antegrade flow.  05/08/2014 2:18 PM Gertie FeyMichelle Jwan Hornbaker, RVT, RDCS, RDMS

## 2014-05-08 NOTE — ED Notes (Signed)
Patient reports he uses a walker at home. CBG is 109

## 2014-05-08 NOTE — Progress Notes (Signed)
PT Cancellation Note  Patient Details Name: Tony GiovanniDarrell W Odland MRN: [id number] DOB: 29-Apr-1946   Cancelled Treatment:    Reason Eval/Treat Not Completed: Patient at procedure or test/unavailable. Pt off floor at MRI. PT to return as able.   Marcene BrawnChadwell, Elisavet Buehrer Marie 05/08/2014, 9:14 AM  Lewis ShockAshly Erielle Gawronski, PT, DPT Pager #: [phone] Office #: [phone]

## 2014-05-08 NOTE — Progress Notes (Signed)
OT NOTE   Recommend RN staff hoyer lift patient from bed <>chair at this time due to LT side weakness. Pt is high fall risk. LT UE should be elevated with wash cloth in palm to prevent skin break down.   Mateo FlowJones, Brynn   OTR/L Pager: [phone] Office: [phone] .

## 2014-05-08 NOTE — Consult Note (Signed)
Neurology Consultation Reason for Consult: Difficulty speaking Referring Physician: Edmonson BlasNewton, S.  CC: Slurred speech  History is obtained from: Patient  HPI: Tony GiovanniDarrell W Burgoon is a 68 y.o. male with a history of previous stroke causing left hemiparesis who presents with trouble over the past couple of days of increased slurred speech and mild confusion. He also states that he has been laughing more frequently than he has in the past as well as crying easily.  He states that he was in this state all day yesterday, with 7/25 prior to bedtime in last time he knows that he was his normal self.   LKW: 7/25 tpa given?: no, outside of window    ROS: A 14 point ROS was performed and is negative except as noted in the HPI.   Past Medical History  Diagnosis Date  . Diabetes mellitus   . Hypertension   . CVA (cerebral infarction)     hx of    Family History: Father-Roque  Social History: Tob: Current smoker  Exam: Current vital signs: BP 179/82  Pulse 63  Temp(Src) 98.8 F (37.1 C) (Oral)  Resp 20  Ht 6\' 1"  (1.854 m)  Wt 87.59 kg (193 lb 1.6 oz)  BMI 25.48 kg/m2  SpO2 99% Vital signs in last 24 hours: Temp:  [98 F (36.7 C)-98.8 F (37.1 C)] 98.8 F (37.1 C) (07/28 0136) Pulse Rate:  [55-78] 63 (07/28 0136) Resp:  [13-29] 20 (07/28 0136) BP: (156-222)/(76-97) 179/82 mmHg (07/28 0136) SpO2:  [96 %-100 %] 99 % (07/28 0136) Weight:  [87.59 kg (193 lb 1.6 oz)] 87.59 kg (193 lb 1.6 oz) (07/28 0136)  General: In bed, NAD CV: Regular rate and rhythm Mental Status: Patient is awake, alert, oriented to person, place, month, year, and situation. Immediate and remote memory are intact. Patient is able to give a clear and coherent history. No signs of aphasia or neglect Cranial Nerves: II: Visual Fields are full. Pupils are equal, round, and reactive to light.  Discs are difficult to visualize. III,IV, VI: EOMI without ptosis or diploplia.  V: Facial sensation is symmetric to  temperature VII: Facial movement is notable for left weakness VIII: hearing is intact to voice X: Uvula elevates symmetrically XI: Shoulder shrug is symmetric. XII: tongue is midline without atrophy or fasciculations.  Motor: Tone is normal. Bulk is normal. 5/5 strength was present in right side, he has a spastic hemiparesis with no movement of his left arm and minimal 1/5 movement of his left leg Sensory: Sensation is decreased in the left arm and leg Deep Tendon Reflexes: 2+  the biceps and patellae on the right, increased on left Cerebellar: Able to perform on right Gait: Not performed secondary to weakness         I have reviewed labs in epic and the results pertinent to this consultation are: Leukocytosis  I have reviewed the images obtained: CT head-old infarcts  Impression: 68 year old male with a history of previous strokes who presents with several days of increased slurred speech and  mild subjective cognitive complaints. It is possible that a physiological stressor such as a urinary tract infection could be unmasking his previous deficits causing increased slurred speech.  Recommendations: 1) MRI brain, would not pursue further stroke workup unless this is positive 2) we'll continue to follow    Ritta SlotMcNeill Kamarius Buckbee, MD [facility] [phone]  If 7pm- 7am, please page neurology on call as listed in AMION.

## 2014-05-08 NOTE — ED Notes (Signed)
Called X-ray to pick up patient. No MR of brain ordered.

## 2014-05-08 NOTE — Evaluation (Signed)
Occupational Therapy Evaluation Patient Details Name: Tony GiovanniDarrell W Bain MRN: [id number] DOB: 1946-06-14 Today's Date: 05/08/2014    History of Present Illness Pt is a 68 yo male presenting with aphasia and confusion. Admitted 7/27. MRI + for acute L pontine infarct. Pt with h/o of previous strokes with residual L spastic hemiplegia.   Clinical Impression   PT admitted with Pontine infarct Lt. Pt currently with functional limitiations due to the deficits listed below (see OT problem list). PTA total (A) for meal prep and min (A) for adls living in a group home Pt will benefit from skilled OT to increase their independence and safety with adls and balance to allow discharge SNF. Ot to follow acutely for basic transfers, static sitting balance and adl retraining.      Follow Up Recommendations  SNF;Supervision/Assistance - 24 hour    Equipment Recommendations  None recommended by OT    Recommendations for Other Services       Precautions / Restrictions Precautions Precautions: Fall Restrictions Weight Bearing Restrictions: No      Mobility Bed Mobility Overal bed mobility: Needs Assistance Bed Mobility: Rolling;Sidelying to Sit Rolling: Max assist Sidelying to sit: Max assist       General bed mobility comments: in chair on arrival  Transfers Overall transfer level: Needs assistance Equipment used: Hemi-walker (2 person forward lift with gait belt) Transfers: Sit to/from Stand Sit to Stand: Max assist;+2 physical assistance Stand pivot transfers: Max assist;+2 physical assistance       General transfer comment: Pt initiated core activiation but unable to come to full upright sitting position. pt required max (A) to static sit at edge of chari with posterior lean. pt attempted sit<>Stand but unable to complete static standing at chair level. Pt complete squat position and had to return to sitting    Balance Overall balance assessment: Needs assistance Sitting-balance  support: Feet supported;Single extremity supported Sitting balance-Leahy Scale: Poor Sitting balance - Comments: pt unable to maintain balance > 10 sec, + retropulsion and L lateral lean Postural control: Posterior lean Standing balance support: Single extremity supported;During functional activity Standing balance-Leahy Scale: Zero Standing balance comment: requires assist to maintain standing posture                            ADL Overall ADL's : Needs assistance/impaired Eating/Feeding: NPO   Grooming: Oral care;Set up;Sitting Grooming Details (indicate cue type and reason): cues to continue task and brush entire mouth. pt without teeth and needed education on the importance of oral care Upper Body Bathing: Maximal assistance   Lower Body Bathing: +2 for physical assistance;Maximal assistance             Toilet Transfer Details (indicate cue type and reason): currently with foley           General ADL Comments: Pt in chair on arrival and unable to pull handle to lower bil LE onto the floor. pt required max (A). pt agreeable to attempting sit<>STand. Pt unable to static stand to attempt oral care . pt provided oral care in seated position. pt provided PROM LT UE and repositioned. vision assessed in chair. SLP arriving for oral assessment     Vision Eye Alignment: Within Functional Limits Alignment/Gaze Preference: Head turned;Head tilt Ocular Range of Motion: Impaired-to be further tested in functional context Tracking/Visual Pursuits: Decreased smoothness of horizontal tracking;Requires cues, head turns, or add eye shifts to track;Impaired - to be further tested in functional  context         Additional Comments: Pt noted with Lt lateral movement Lt eye to have nystagmus. Question INO deficits based on pontine infarct confirmed on MRI. PT however with additional head turns so further assessment to be complete   Perception     Praxis      Pertinent  Vitals/Pain Pt very emotional about current situation No pain reported     Hand Dominance Right   Extremity/Trunk Assessment Upper Extremity Assessment Upper Extremity Assessment: LUE deficits/detail LUE Deficits / Details: spastic hemiplegia, increased flexor tone, claw hand with wrist extension LUE Sensation: decreased light touch LUE Coordination: decreased fine motor;decreased gross motor   Lower Extremity Assessment Lower Extremity Assessment: Defer to PT evaluation LLE Deficits / Details: grossly 1/5   Cervical / Trunk Assessment Cervical / Trunk Assessment: Normal   Communication Communication Communication: Expressive difficulties (soft, spoken, slurred but able to understand pt)   Cognition Arousal/Alertness: Awake/alert Behavior During Therapy: WFL for tasks assessed/performed Overall Cognitive Status: Within Functional Limits for tasks assessed                     General Comments       Exercises Exercises: Other exercises Other Exercises Other Exercises: PROM LT UE: digits, thumb, wrist , elbow- noted spastic tone   Shoulder Instructions      Home Living Family/patient expects to be discharged to:: Group home                                 Additional Comments: pt reports he rents a room out.      Prior Functioning/Environment Level of Independence: Independent with assistive device(s)        Comments: pt reports amb with hemi-walker and performing dressing/bathing mod I. He reports he has a walk in shower with seat and owner is buying a hand held shower. pt reports they have supportive staff to make meals and clean but no one can help him with transfers or ADLs. Pt reports having a male aid make breakfast and dinner daily. She leaves sandwiches in a bag for lunch.     OT Diagnosis: Generalized weakness;Hemiplegia non-dominant side   OT Problem List: Decreased strength;Decreased activity tolerance;Decreased range of  motion;Impaired balance (sitting and/or standing);Impaired vision/perception;Decreased safety awareness;Decreased knowledge of use of DME or AE;Decreased knowledge of precautions;Impaired sensation;Impaired tone;Obesity;Impaired UE functional use   OT Treatment/Interventions: Self-care/ADL training;Therapeutic exercise;Neuromuscular education;DME and/or AE instruction;Splinting;Therapeutic activities;Visual/perceptual remediation/compensation;Patient/family education;Balance training    OT Goals(Current goals can be found in the care plan section) Acute Rehab OT Goals Patient Stated Goal: to work with therapy and go back home OT Goal Formulation: With patient Time For Goal Achievement: 05/22/14 Potential to Achieve Goals: Good  OT Frequency: Min 2X/week   Barriers to D/C: Decreased caregiver support  from group home must be mod I to return       Co-evaluation              End of Session Equipment Utilized During Treatment: Gait belt Nurse Communication: Mobility status;Precautions;Need for lift equipment  Activity Tolerance: Patient tolerated treatment well Patient left: in chair;with call bell/phone within reach   Time: 1610-9604 OT Time Calculation (min): 28 min Charges:  OT General Charges $OT Visit: 1 Procedure OT Evaluation $Initial OT Evaluation Tier I: 1 Procedure OT Treatments $Self Care/Home Management : 8-22 mins $Therapeutic Activity: 8-22 mins G-Codes:    Boone Master  B 05/08/2014, 11:45 AM Pager: [phone]

## 2014-05-09 DIAGNOSIS — I10 Essential (primary) hypertension: Secondary | ICD-10-CM

## 2014-05-09 DIAGNOSIS — I6350 Cerebral infarction due to unspecified occlusion or stenosis of unspecified cerebral artery: Secondary | ICD-10-CM | POA: Diagnosis not present

## 2014-05-09 LAB — GLUCOSE, CAPILLARY
GLUCOSE-CAPILLARY: 109 mg/dL — AB (ref 70–99)
Glucose-Capillary: 74 mg/dL (ref 70–99)
Glucose-Capillary: 127 mg/dL — ABNORMAL HIGH (ref 70–99)
Glucose-Capillary: 100 mg/dL — ABNORMAL HIGH (ref 70–99)
Glucose-Capillary: 101 mg/dL — ABNORMAL HIGH (ref 70–99)

## 2014-05-09 LAB — URINE CULTURE
Colony Count: NO GROWTH
Colony Count: NO GROWTH
Culture: NO GROWTH
Culture: NO GROWTH

## 2014-05-09 LAB — BASIC METABOLIC PANEL
Anion gap: 16 — ABNORMAL HIGH (ref 5–15)
BUN: 11 mg/dL (ref 6–23)
CHLORIDE: 107 meq/L (ref 96–112)
CO2: 18 mEq/L — ABNORMAL LOW (ref 19–32)
Calcium: 8.8 mg/dL (ref 8.4–10.5)
Creatinine, Ser: 0.71 mg/dL (ref 0.50–1.35)
GFR calc non Af Amer: >90 mL/min (ref >90)
Glucose, Bld: 97 mg/dL (ref 70–99)
POTASSIUM: 4.5 meq/L (ref 3.7–5.3)
SODIUM: 141 meq/L (ref 137–147)

## 2014-05-09 MED ORDER — ASPIRIN 325 MG PO TABS
325.0000 mg | ORAL_TABLET | Freq: Every day | ORAL | Status: DC
  Administered 2014-05-09 – 2014-05-10 (×2): 325 mg via ORAL
  Filled 2014-05-09 (×2): qty 1

## 2014-05-09 NOTE — Progress Notes (Signed)
Physical Therapy Treatment Patient Details Name: Tony Johnson MRN: [id number] DOB: Oct 13, 1945 Today's Date: 05/09/2014    History of Present Illness Pt is a 68 yo male presenting with aphasia and confusion. Admitted 7/27. MRI + for acute L pontine infarct. Pt with h/o of previous strokes with residual L spastic hemiplegia.    PT Comments    Pt con't to require maxAx2 for transfers but was able to maintain sitting balance with UE support this date. Pt con't to have slurred speech. Pt con't to benefit from SNF upon d/c to allow for maximal functional recovery.  Follow Up Recommendations  SNF;Supervision/Assistance - 24 hour     Equipment Recommendations       Recommendations for Other Services       Precautions / Restrictions Precautions Precautions: Fall Restrictions Weight Bearing Restrictions: No    Mobility  Bed Mobility Overal bed mobility: Needs Assistance Bed Mobility: Rolling;Sidelying to Sit Rolling: Max assist Sidelying to sit: Max assist       General bed mobility comments: maxA for L LE mangement, maxA for trunk elevation, maxA to bring hips to EOB  Transfers Overall transfer level: Needs assistance Equipment used:  (2 person dep lift with gait belt and bed pad) Transfers: Sit to/from [employer] Transfers Sit to Stand: Max assist;+2 physical assistance Stand pivot transfers: Max assist;+2 physical assistance       General transfer comment: pt initiated transfer however unable to complete full stand despite max tactile and verbal cues  Ambulation/Gait                 Stairs            Wheelchair Mobility    Modified Rankin (Stroke Patients Only) Modified Rankin (Stroke Patients Only) Pre-Morbid Rankin Score: Moderately severe disability Modified Rankin: Severe disability     Balance Overall balance assessment: Needs assistance Sitting-balance support: Feet supported;Single extremity supported;No upper extremity  supported Sitting balance-Leahy Scale: Poor Sitting balance - Comments: worked on sitting balance. pt able to maintain sitting balance x 1 min once assist in apporiate position. worked on dynamic reaching- pt able to maintain balance with reaching unless reaching to R side. worked on using R UE to push self up but required modA and tactile cues to hand and tricep Postural control: Right lateral lean                          Cognition Arousal/Alertness: Awake/alert Behavior During Therapy: WFL for tasks assessed/performed Overall Cognitive Status: Impaired/Different from baseline Area of Impairment: Problem solving             Problem Solving: Slow processing;Difficulty sequencing;Requires verbal cues;Requires tactile cues      Exercises      General Comments        Pertinent Vitals/Pain Denies pain    Home Living                      Prior Function            PT Goals (current goals can now be found in the care plan section) Progress towards PT goals: Progressing toward goals    Frequency  Min 3X/week    PT Plan Frequency needs to be updated    Co-evaluation             End of Session Equipment Utilized During Treatment: Gait belt Activity Tolerance: Patient tolerated treatment well Patient left: in chair;with  call bell/phone within reach;with chair alarm set     Time: [id number] PT Time Calculation (min): 24 min  Charges:  $Therapeutic Activity: 8-22 mins $Neuromuscular Re-education: 8-22 mins                    G Codes:      Marcene BrawnChadwell, Berish Bohman Marie 05/09/2014, 11:09 AM  Lewis ShockAshly Gisele Pack, PT, DPT Pager #: [phone] Office #: [phone]

## 2014-05-09 NOTE — Progress Notes (Signed)
Speech Language Pathology Treatment: Dysphagia;Cognitive-Linquistic  Patient Details Name: Tony Johnson MRN: [id number] DOB: [birth year] Today's Date: 05/09/2014 Time: 4497-5300 SLP Time Calculation (min): 45 min  Assessment / Plan / Recommendation Clinical Impression  Pt continues to demonstrate signs of inadequate airway protection with thin liquids. Suspect pt is penetrating/aspirating his secretions due to intermittent without PO intake; SLP set up suction kit and provided oral care kits as well as education regarding secretion management. Pt demonstrated immediate cough with mixed bolus consistency containing Dys 3 textures and nectar thick liquids. Education and Min cues were provided to avoid mixed consistencies.  SLP provided written handout with speech intelligibility strategies, which pt utilized at the phrase level with Min-Mod cues. Min cues were provided for redirection to task in a quiet environment. Will continue to follow.   HPI HPI: Pt is a 68 yo male presenting with aphasia and confusion. Admitted 7/27. MRI + for acute L pontine infarct. Pt with h/o of previous strokes with residual L spastic hemiplegia.    Pertinent Vitals n/a  SLP Plan  Continue with current plan of care    Recommendations Diet recommendations: Dysphagia 2 (fine chop);Nectar-thick liquid Liquids provided via: Cup;No straw Medication Administration: Whole meds with puree Supervision: Patient able to self feed;Full supervision/cueing for compensatory strategies Compensations: Slow rate;Small sips/bites;Check for pocketing Postural Changes and/or Swallow Maneuvers: Seated upright 90 degrees;Upright 30-60 min after meal              Oral Care Recommendations: Oral care BID Follow up Recommendations: Skilled Nursing facility;24 hour supervision/assistance Plan: Continue with current plan of care    GO      Tony Johnson, M.A. CCC-SLP [phone]  Tony Johnson 05/09/2014, 9:47  AM

## 2014-05-09 NOTE — Clinical Social Work Psychosocial (Signed)
Clinical Social Work Department BRIEF PSYCHOSOCIAL ASSESSMENT 05/09/2014  Patient:  Tony Johnson, Tony Johnson     Account Number:  [id number]     Admit date:  05/07/2014  Clinical Social Worker:  Delrae Sawyers  Date/Time:  05/08/2014 01:45 PM  Referred by:  Physician  Date Referred:  05/08/2014 Referred for  SNF Placement   Other Referral:   none.   Interview type:  Patient Other interview type:   none.    PSYCHOSOCIAL DATA Living Status:  OTHER Admitted from facility:   Level of care:   Primary support name:  Domnique Primary support relationship to patient:  FRIEND Degree of support available:   Adequate support system.    CURRENT CONCERNS Current Concerns  Post-Acute Placement   Other Concerns:   none.    SOCIAL WORK ASSESSMENT / PLAN CSW received consult for SNF placement at time of discharge. CSW met with pt at bedside to discuss discharge disposition. Pt stated he lived down the street with "others." Pt reported his main support to be from [facility], his friend. Pt stated he was agreeable to SNF placement for short-term rehabilitation and was unsure of which facility he was interested in. Pt informed CSW pt was previously admitted to [facility] after his first stroke (5 years ago).    CSW to continue to follow and assist with discharge planning needs.   Assessment/plan status:  Psychosocial Support/Ongoing Assessment of Needs Other assessment/ plan:   none.   Information/referral to community resources:   [facility] bed offers.    PATIENT'S/FAMILY'S RESPONSE TO PLAN OF CARE: Pt understanding and agreeable to CSW plan of care. Pt expressed no further questions or concerns at this time.       Lubertha Sayres, MSW, [facility] Licensed Clinical Social Worker [phone] and [phone]

## 2014-05-09 NOTE — Progress Notes (Signed)
Subjective: Patient continues to have slurred speech.  No other complaints.   Objective: Current vital signs: BP 162/69  Pulse 62  Temp(Src) 98.9 F (37.2 C) (Oral)  Resp 20  Ht 6\' 1"  (1.854 m)  Wt 87.59 kg (193 lb 1.6 oz)  BMI 25.48 kg/m2  SpO2 100% Vital signs in last 24 hours: Temp:  [97.7 F (36.5 C)-99.1 F (37.3 C)] 98.9 F (37.2 C) (07/29 1042) Pulse Rate:  [52-76] 62 (07/29 1042) Resp:  [16-20] 20 (07/29 1042) BP: (133-195)/(63-97) 162/69 mmHg (07/29 1042) SpO2:  [99 %-100 %] 100 % (07/29 1042)  Intake/Output from previous day: 07/28 0701 - 07/29 0700 In: 600 [P.O.:600] Out: 1500 [Urine:1500] Intake/Output this shift: Total I/O In: 120 [P.O.:120] Out: -  Nutritional status: Dysphagia  Neurologic Exam: General: NAD Mental Status: Alert, oriented, thought content appropriate.  Speech dysarthric without evidence of aphasia.  Able to follow 3 step commands without difficulty. Cranial Nerves: II: Visual fields grossly normal, pupils equal, round, reactive to light and accommodation III,IV, VI: ptosis not present, extra-ocular motions intact bilaterally V,VII: smile symmetric, facial light touch sensation normal bilaterally VIII: hearing normal bilaterally IX,X: gag reflex present XI: bilateral shoulder shrug XII: midline tongue extension without atrophy or fasciculations  Motor: Right : Upper extremity   5/5    Left:     Upper extremity   0/5  Lower extremity   5/5     Lower extremity   0/5 Tone and bulk:normal tone throughout; no atrophy noted Sensory: Pinprick and light touch decreased on the left Deep Tendon Reflexes:  Right: Upper Extremity   Left: Upper extremity   biceps (C-5 to C-6) 2/4   biceps (C-5 to C-6) 2/4 tricep (C7) 2/4    triceps (C7) 2/4 Brachioradialis (C6) 2/4  Brachioradialis (C6) 2/4  Lower Extremity Lower Extremity  quadriceps (L-2 to L-4) 2/4   quadriceps (L-2 to L-4) 3/4 Achilles (S1) 0/4   Achilles (S1) 3/4  Plantars: Mute  bilaterally Cerebellar: normal finger-to-nose,  normal heel-to-shin test CV: pulses palpable throughout    Lab Results: Basic Metabolic Panel:  Recent Labs Lab 05/07/14 1941 05/07/14 2352 05/09/14 0626  NA 141  --  141  K 3.9  --  4.5  CL 104  --  107  CO2 21  --  18*  GLUCOSE 100*  --  97  BUN 12  --  11  CREATININE 0.64 0.65 0.71  CALCIUM 9.4  --  8.8    Liver Function Tests:  Recent Labs Lab 05/07/14 1941  AST 13  ALT 9  ALKPHOS 82  BILITOT 0.3  PROT 7.4  ALBUMIN 4.3   No results found for this basename: LIPASE, AMYLASE,  in the last 168 hours  Recent Labs Lab 05/07/14 2358  AMMONIA 28    CBC:  Recent Labs Lab 05/07/14 1941 05/07/14 2352  WBC 11.3* 10.7*  NEUTROABS 9.7*  --   HGB 14.0 13.6  HCT 41.5 39.9  MCV 94.7 92.4  PLT 285 296    Cardiac Enzymes:  Recent Labs Lab 05/07/14 1941  TROPONINI <0.30    Lipid Panel:  Recent Labs Lab 05/08/14 0535  CHOL 94  TRIG 69  HDL 39*  CHOLHDL 2.4  VLDL 14  LDLCALC 41    CBG:  Recent Labs Lab 05/08/14 1235 05/08/14 1642 05/08/14 2142 05/09/14 0640 05/09/14 1156  GLUCAP 125* 74 114* 109* 127*    Microbiology: Results for orders placed during the hospital encounter of 05/07/14  URINE  CULTURE     Status: None   Collection Time    05/07/14  9:47 PM      Result Value Ref Range Status   Specimen Description URINE, RANDOM   Final   Special Requests NONE   Final   Culture  Setup Time     Final   Value: 05/08/2014 00:55     Performed at [facility]   Colony Count     Final   Value: NO GROWTH     [facility]formed at [facility]   Culture     Final   Value: NO GROWTH     Performed at [facility]   Report Status 05/09/2014 FINAL   Final  CULTURE, BLOOD (ROUTINE X 2)     Status: None   Collection Time    05/07/14 11:46 PM      Result Value Ref Range Status   Specimen Description BLOOD RIGHT FOREARM   Final   Special Requests BOTTLES DRAWN AEROBIC AND  ANAEROBIC 10CC EA   Final   Culture  Setup Time     Final   Value: 05/08/2014 08:31     Performed at [facility]   Culture     Final   Value:        BLOOD CULTURE RECEIVED NO GROWTH TO DATE CULTURE WILL BE HELD FOR 5 DAYS BEFORE ISSUING A FINAL NEGATIVE REPORT     Performed at [facility]   Report Status PENDING   Incomplete  CULTURE, BLOOD (ROUTINE X 2)     Status: None   Collection Time    05/07/14 11:52 PM      Result Value Ref Range Status   Specimen Description BLOOD RIGHT HAND   Final   Special Requests BOTTLES DRAWN AEROBIC ONLY 6CC   Final   Culture  Setup Time     Final   Value: 05/08/2014 08:31     Performed at [facility]   Culture     Final   Value:        BLOOD CULTURE RECEIVED NO GROWTH TO DATE CULTURE WILL BE HELD FOR 5 DAYS BEFORE ISSUING A FINAL NEGATIVE REPORT     Performed at [facility]   Report Status PENDING   Incomplete    Coagulation Studies: No results found for this basename: LABPROT, INR,  in the last 72 hours  Imaging: Dg Chest 2 View  05/08/2014   CLINICAL DATA:  Stroke today.  Left-sided weakness.  EXAM: CHEST  2 VIEW  COMPARISON:  07/12/2009  FINDINGS: The heart size and mediastinal contours are within normal limits. Both lungs are clear. The visualized skeletal structures are unremarkable.  IMPRESSION: Normal chest.   Electronically Signed   By: Geanie CooleyJim  Maxwell M.D.   On: 05/08/2014 01:26   Ct Head Wo Contrast  05/07/2014   CLINICAL DATA:  Slurred speech  EXAM: CT HEAD WITHOUT CONTRAST  TECHNIQUE: Contiguous axial images were obtained from the base of the skull through the vertex without intravenous contrast.  COMPARISON:  07/12/2009  FINDINGS: No skull fracture is noted. Paranasal sinuses and mastoid air cells are unremarkable. Stable atrophy and chronic white matter disease. Stable lacunar infarcts in right parietal lobe and frontal lobe. A lacunar infarct in right internal capsule again noted. No intracranial  hemorrhage, mass effect or midline shift. No definite acute cortical infarction. No mass lesion is noted on this unenhanced scan.  IMPRESSION: No acute intracranial abnormality. Stable atrophy  and chronic white matter disease. Stable lacunar infarcts as described above. No definite acute cortical infarction.   Electronically Signed   By: Natasha Mead M.D.   On: 05/07/2014 20:05   Mr Brain Wo Contrast  05/08/2014   CLINICAL DATA:  68 year old male with diabetes and high blood pressure and prior stroke presenting with 2 day history of increased slurred speech and mild confusion.  EXAM: MRI HEAD WITHOUT CONTRAST  MRA HEAD WITHOUT CONTRAST  TECHNIQUE: Multiplanar, multiecho pulse sequences of the brain and surrounding structures were obtained without intravenous contrast. Angiographic images of the head were obtained using MRA technique without contrast.  COMPARISON:  05/07/2014 CT.  04/21/2007 MR.  FINDINGS: MRI HEAD FINDINGS  Acute nonhemorrhagic left pontine infarct.  Remote posterior right operculum region, posterior right frontal lobe and right parietal lobe infarcts. Remote infarct posterior limb right internal capsule, posterior right corona radiata and right thalamus with Wallerian degeneration and small right cerebral peduncle. Minimal amount of blood breakdown products associated with the remote right hemispheric infarcts (and along the periphery of the right frontal -temporal lobe). Subsequent mild dilation right lateral ventricle.  Remote anterior inferior left frontal lobe infarct and encephalomalacia.  Global atrophy with ventricular prominence felt to be related to atrophy and remote infarcts.  No intracranial mass lesion noted on this unenhanced exam.  Partial opacification anterior superior ethmoid sinus air cells. Minimal mucosal thickening remainder ethmoid sinus air cells and maxillary sinuses.  Cervical medullary junction, pituitary region, pineal region and orbital structures unremarkable.  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 The slightly decreased caliber of the right internal carotid artery may be partially explained by this configuration although result of decreased intracranial flow from prior infarcts or proximal stenosis not excluded.  Mild ectasia of the left internal carotid artery proximal to the skullbase.  Moderate to marked narrowing distal M1 segment right middle cerebral artery extending to involve the bifurcation. Mild moderate right middle cerebral artery branch vessel narrowing and irregularity.  Moderate narrowing distal M1 segment left middle cerebral artery. Marked narrowing left middle cerebral artery bifurcation. Mild to moderate narrowing left middle cerebral artery branch vessels.  Small caliber and irregularity of the A2 segment of the right anterior cerebral artery.  Ectatic vertebral arteries. Moderate to marked narrowing distal left vertebral artery.  Nonvisualized right posterior inferior cerebellar artery.  Moderate to slightly marked narrowing and irregularity proximal to mid aspect of the basilar artery.  Nonvisualized anterior inferior cerebellar arteries.  Irregular narrowed left superior cerebellar artery.  Mild to slightly moderate tandem stenosis posterior cerebral artery more notable on the right and involving distal branches.  No aneurysm identified.  IMPRESSION: Acute nonhemorrhagic left pontine infarct.  Remote infarcts, atrophy, paranasal sinus mucosal thickening and intracranial atherosclerotic type changes as detailed above.   Electronically Signed   By: Bridgett Larsson M.D.   On: 05/08/2014 09:26   Mr Maxine Glenn Head/brain Wo Cm  05/08/2014   CLINICAL DATA:  68 year old male with diabetes and high blood pressure and prior stroke presenting with 2 day history of increased slurred speech and mild confusion.  EXAM: MRI HEAD WITHOUT CONTRAST  MRA HEAD WITHOUT CONTRAST  TECHNIQUE: Multiplanar, multiecho pulse sequences of the brain and  surrounding structures were obtained without intravenous contrast. Angiographic images of the head were obtained using MRA technique without contrast.  COMPARISON:  05/07/2014 CT.  04/21/2007 MR.  FINDINGS: MRI HEAD FINDINGS  Acute nonhemorrhagic left pontine infarct.  Remote posterior right operculum region,  posterior right frontal lobe and right parietal lobe infarcts. Remote infarct posterior limb right internal capsule, posterior right corona radiata and right thalamus with Wallerian degeneration and small right cerebral peduncle. Minimal amount of blood breakdown products associated with the remote right hemispheric infarcts (and along the periphery of the right frontal -temporal lobe). Subsequent mild dilation right lateral ventricle.  Remote anterior inferior left frontal lobe infarct and encephalomalacia.  Global atrophy with ventricular prominence felt to be related to atrophy and remote infarcts.  No intracranial mass lesion noted on this unenhanced exam.  Partial opacification anterior superior ethmoid sinus air cells. Minimal mucosal thickening remainder ethmoid sinus air cells and maxillary sinuses.  Cervical medullary junction, pituitary region, pineal region and orbital structures unremarkable.  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 The slightly decreased caliber of the right internal carotid artery may be partially explained by this configuration although result of decreased intracranial flow from prior infarcts or proximal stenosis not excluded.  Mild ectasia of the left internal carotid artery proximal to the skullbase.  Moderate to marked narrowing distal M1 segment right middle cerebral artery extending to involve the bifurcation. Mild moderate right middle cerebral artery branch vessel narrowing and irregularity.  Moderate narrowing distal M1 segment left middle cerebral artery. Marked narrowing left middle cerebral artery bifurcation. Mild to moderate narrowing  left middle cerebral artery branch vessels.  Small caliber and irregularity of the A2 segment of the right anterior cerebral artery.  Ectatic vertebral arteries. Moderate to marked narrowing distal left vertebral artery.  Nonvisualized right posterior inferior cerebellar artery.  Moderate to slightly marked narrowing and irregularity proximal to mid aspect of the basilar artery.  Nonvisualized anterior inferior cerebellar arteries.  Irregular narrowed left superior cerebellar artery.  Mild to slightly moderate tandem stenosis posterior cerebral artery more notable on the right and involving distal branches.  No aneurysm identified.  IMPRESSION: Acute nonhemorrhagic left pontine infarct.  Remote infarcts, atrophy, paranasal sinus mucosal thickening and intracranial atherosclerotic type changes as detailed above.   Electronically Signed   By: Bridgett Larsson M.D.   On: 05/08/2014 09:26    Medications:  Scheduled: . aspirin  325 mg Oral Daily  . cefTRIAXone (ROCEPHIN)  IV  1 g Intravenous Q24H  . heparin  5,000 Units Subcutaneous 3 times per day  . insulin aspart  0-9 Units Subcutaneous TID WC  . ramipril  10 mg Oral Daily  . simvastatin  20 mg Oral q1800   Vascular Ultrasound  Carotid Duplex (Doppler) has been completed.  Findings suggest 1-39% internal carotid artery stenosis bilaterally. Vertebral arteries are patent with antegrade flow.  Study Conclusions 2 D echo  - Left ventricle: The cavity size was normal. Wall thickness was increased in a pattern of mild LVH. Systolic function was vigorous. The estimated ejection fraction was in the range of 65% to 70%. Wall motion was normal; there were no regional wall motion abnormalities. - Aortic valve: Sclerosis without stenosis. There was no significant regurgitation. - Impressions: No cardiac source of embolism was identified, but cannot be ruled out on the basis of this examination.  Impressions:  - No cardiac source of embolism was  identified, but cannot be ruled out on the basis of this examination.  LDL 41  Assessment/Plan:  68 year old male with a history of previous strokes who presents with several days of increased slurred speech in the setting of elevated BP.  MRI revealed a acute right pontine infarct.   Recommend:  1)  Continue ASA  2) BP Control  3) Follow up with neurology out patient in 1 month.   Neurology S/O  Felicie Morn PA-C [facility] [phone]  05/09/2014, 12:20 PM

## 2014-05-09 NOTE — Clinical Social Work Placement (Addendum)
Clinical Social Work Department CLINICAL SOCIAL WORK PLACEMENT NOTE 05/09/2014  Patient:  Tony Johnson,Tony Johnson  Account Number:  [id number] Admit date:  05/07/2014  Clinical Social Worker:  Mosie EpsteinEMILY S VAUGHN, LCSWA  Date/time:  05/08/2014 02:03 PM  Clinical Social Work is seeking post-discharge placement for this patient at the following level of care:   SKILLED NURSING   (*CSW will update this form in Epic as items are completed)   05/08/2014  Patient/family provided with [facility] Department of Clinical Social Work's list of facilities offering this level of care within the geographic area requested by the patient (or if unable, by the patient's family).  05/08/2014  Patient/family informed of their freedom to choose among providers that offer the needed level of care, that participate in Medicare, Medicaid or managed care program needed by the patient, have an available bed and are willing to accept the patient.  05/08/2014  Patient/family informed of [facility]' ownership interest in [facility], as well as of the fact that they are under no obligation to receive care at this facility.  PASARR submitted to EDS on 05/08/2014 PASARR number received on 05/08/2014  FL2 transmitted to all facilities in geographic area requested by pt/family on  05/08/2014 FL2 transmitted to all facilities within larger geographic area on   Patient informed that his/her managed care company has contracts with or will negotiate with  certain facilities, including the following:     Patient/family informed of bed offers received:  05/09/2014 [facility] Physician recommends and patient chooses bed at    [facility]  05/11/14 Patient to be transferred to facility by [facility] Patient and family notified of transfer on 05/11/14 Name of family member notified:  CSW left a message for pt's daughter Ms. Hunter informing her of  discharge. CSW also left the address and phone number of the facility.  The following physician request were entered in Epic:   Additional Comments:  Marcelline DeistEmily Vaughn, MSW, [facility] Licensed Clinical Social Worker [phone] and [phone] [phone]

## 2014-05-09 NOTE — Progress Notes (Signed)
[facility]                                                                              Patient Demographics  Tony Johnson, is a 68 y.o. male, DOB - May 04, 1946, WUX:[id number]  Admit date - 05/07/2014   Admitting Physician Doree AlbeeSteven Newton, MD  Outpatient Primary MD for the patient is No primary provider on file.  LOS - 2   Chief Complaint  Patient presents with  . Aphasia      HPI on 05/07/2014 This is a 68 y.o. year old male with significant past medical history of CVA with baseline left sided hemiparesis, type 2 diabetes mellitus, hyperlipidemia presenting with aphasia. Patient stated that he had worsening aphasia and difficulty speaking 2 days before admission. Has also had worsening left sided weakness. Denied any chest pain, shortness of breath, nausea or vomiting. Stated that he has baseline urinary incontinence on a regular basis, which has worsened. On arrival to ER, patient was afebrile. SBPs 150s-190s. Satting >96% on RA. WBC 11.3, Hgb 14, Cr 0.64, Glu 100. Head CT negative for any acute intracranial abnormalities. Stable Right lacunar infarct. UA preliminarily not indicative of UTI.   Assessment & Plan   Acute left pontine infarct -Found on MRI -Patient has had CVA with residual left-sided weakness, and was on Aggrenox prior to admission -Echocardiogram his EF 65-70% with no source of emboli -Carotid Doppler 1-39% internal carotid artery stenosis bilaterally. Vertebral arteries are patent with antegrade flow. -LDL 41, hemoglobin A1c 6.1 -PT and OT consulted and recommended skilled nursing facility -Neurology consulted and pending further evaluation and recommendations -Continue aspirin 325 mg daily and statin  Urinary tract infection -Patient was placed on IV Rocephin, however UA negative for infections x2 -Will discontinue antibiotics  Diabetes mellitus type 2 -Hemoglobin A1c 6.1 -Continue [place] [facility] monitoring  Hypertension -Continue ramipril and PRN  hydralazine  Hyperlipidemia -Continue statin  Code Status: Full  Family Communication:  None at bedside   Disposition Plan: Admitted   Time Spent in minutes   30  minutes  Procedures  Carotid Doppler 1-39% internal carotid artery stenosis bilaterally. Vertebral arteries are patent with antegrade flow.  Echocardiogram Study Conclusions - Left ventricle: The cavity size was normal. Wall thickness was increased in a pattern of mild LVH. Systolic function was vigorous. The estimated ejection fraction was in the range of 65% to 70%. Wall motion was normal; there were no regional wall motion abnormalities.  - Aortic valve: Sclerosis without stenosis. There was no significant regurgitation. - Impressions: No cardiac source of embolism was identified, but cannot be ruled out on the basis of this examination. Impressions: - No cardiac source of embolism was identified, but cannot be ruled out on the basis of this examination.  Consults   Neurology  DVT Prophylaxis  Heparin  Lab Results  Component Value Date   PLT 296 05/07/2014    Medications  Scheduled Meds: . aspirin  325 mg Oral Daily  . cefTRIAXone (ROCEPHIN)  IV  1 g Intravenous Q24H  . heparin  5,000 Units Subcutaneous 3 times per day  . insulin aspart  0-9 Units Subcutaneous TID WC  . ramipril  10 mg Oral  Daily  . simvastatin  20 mg Oral q1800   Continuous Infusions: . sodium chloride 75 mL/hr at 05/08/14 1218   PRN Meds:.hydrALAZINE, senna-docusate  Antibiotics    Anti-infectives   Start     Dose/Rate Route Frequency Ordered Stop   05/07/14 2330  cefTRIAXone (ROCEPHIN) 1 g in dextrose 5 % 50 mL IVPB     1 g 100 mL/hr over 30 Minutes Intravenous Every 24 hours 05/07/14 2317         Subjective:   Tony Johnson seen and examined today.  Patient continues to have slurred speech.  He denies any new weakness at this time. Patient states he has had left-sided weakness since her last stroke. Currently he denies any  chest pain, shortness of breath, abdominal pain, dizziness, headache.   Objective:   Filed Vitals:   05/08/14 2140 05/09/14 0131 05/09/14 0555 05/09/14 1042  BP: 191/82 133/97 167/63 162/69  Pulse: 75 72 52 62  Temp: 98.8 F (37.1 C) 97.7 F (36.5 C) 98.8 F (37.1 C) 98.9 F (37.2 C)  TempSrc: Oral Oral Oral Oral  Resp: 18 20 16 20   Height:      Weight:      SpO2: 99% 100% 100% 100%    Wt Readings from Last 3 Encounters:  05/08/14 87.59 kg (193 lb 1.6 oz)  09/22/10 94.802 kg (209 lb)  03/24/10 93.668 kg (206 lb 8 oz)     Intake/Output Summary (Last 24 hours) at 05/09/14 1332 Last data filed at 05/09/14 1301  Gross per 24 hour  Intake    720 ml  Output   1100 ml  Net   -380 ml    Exam  General: Well developed, well nourished, NAD, appears stated age  HEENT: NCAT, PERRLA, EOMI, Anicteic Sclera, mucous membranes moist.   Neck: Supple, no JVD, no masses  Cardiovascular: S1 S2 auscultated, no rubs, murmurs or gallops. Regular rate and rhythm.  Respiratory: Clear to auscultation bilaterally with equal chest rise  Abdomen: Soft, nontender, nondistended, + bowel sounds  Extremities: warm dry without cyanosis clubbing or edema  Neuro: AAOx3,Left sided hemiparesis, RUE/RLE 4/5 strength  Skin: Without rashes exudates or nodules  Psych: Normal affect and demeanor with intact judgement and insight  Data Review   Micro Results Recent Results (from the past 240 hour(s))  URINE CULTURE     Status: None   Collection Time    05/07/14  9:47 PM      Result Value Ref Range Status   Specimen Description URINE, RANDOM   Final   Special Requests NONE   Final   Culture  Setup Time     Final   Value: 05/08/2014 00:55     Performed at [facility] Count     Final   Value: NO GROWTH     Performed at [facility]   Culture     Final   Value: NO GROWTH     Performed at [facility]   Report Status 05/09/2014 FINAL   Final  CULTURE, BLOOD  (ROUTINE X 2)     Status: None   Collection Time    05/07/14 11:46 PM      Result Value Ref Range Status   Specimen Description BLOOD RIGHT FOREARM   Final   Special Requests BOTTLES DRAWN AEROBIC AND ANAEROBIC 10CC EA   Final   Culture  Setup Time     Final   Value: 05/08/2014 08:31  Performed at [facility]     Final   Value:        BLOOD CULTURE RECEIVED NO GROWTH TO DATE CULTURE WILL BE HELD FOR 5 DAYS BEFORE ISSUING A FINAL NEGATIVE REPORT     Performed at [facility]   Report Status PENDING   Incomplete  CULTURE, BLOOD (ROUTINE X 2)     Status: None   Collection Time    05/07/14 11:52 PM      Result Value Ref Range Status   Specimen Description BLOOD RIGHT HAND   Final   Special Requests BOTTLES DRAWN AEROBIC ONLY 6CC   Final   Culture  Setup Time     Final   Value: 05/08/2014 08:31     Performed at [facility]   Culture     Final   Value:        BLOOD CULTURE RECEIVED NO GROWTH TO DATE CULTURE WILL BE HELD FOR 5 DAYS BEFORE ISSUING A FINAL NEGATIVE REPORT     Performed at [facility]   Report Status PENDING   Incomplete    Radiology Reports Dg Chest 2 View  05/08/2014   CLINICAL DATA:  Stroke today.  Left-sided weakness.  EXAM: CHEST  2 VIEW  COMPARISON:  07/12/2009  FINDINGS: The heart size and mediastinal contours are within normal limits. Both lungs are clear. The visualized skeletal structures are unremarkable.  IMPRESSION: Normal chest.   Electronically Signed   By: Geanie Cooley M.D.   On: 05/08/2014 01:26   Ct Head Wo Contrast  05/07/2014   CLINICAL DATA:  Slurred speech  EXAM: CT HEAD WITHOUT CONTRAST  TECHNIQUE: Contiguous axial images were obtained from the base of the skull through the vertex without intravenous contrast.  COMPARISON:  07/12/2009  FINDINGS: No skull fracture is noted. Paranasal sinuses and mastoid air cells are unremarkable. Stable atrophy and chronic white matter disease. Stable lacunar infarcts in  right parietal lobe and frontal lobe. A lacunar infarct in right internal capsule again noted. No intracranial hemorrhage, mass effect or midline shift. No definite acute cortical infarction. No mass lesion is noted on this unenhanced scan.  IMPRESSION: No acute intracranial abnormality. Stable atrophy and chronic white matter disease. Stable lacunar infarcts as described above. No definite acute cortical infarction.   Electronically Signed   By: Natasha Mead M.D.   On: 05/07/2014 20:05   Mr Brain Wo Contrast  05/08/2014   CLINICAL DATA:  68 year old male with diabetes and high blood pressure and prior stroke presenting with 2 day history of increased slurred speech and mild confusion.  EXAM: MRI HEAD WITHOUT CONTRAST  MRA HEAD WITHOUT CONTRAST  TECHNIQUE: Multiplanar, multiecho pulse sequences of the brain and surrounding structures were obtained without intravenous contrast. Angiographic images of the head were obtained using MRA technique without contrast.  COMPARISON:  05/07/2014 CT.  04/21/2007 MR.  FINDINGS: MRI HEAD FINDINGS  Acute nonhemorrhagic left pontine infarct.  Remote posterior right operculum region, posterior right frontal lobe and right parietal lobe infarcts. Remote infarct posterior limb right internal capsule, posterior right corona radiata and right thalamus with Wallerian degeneration and small right cerebral peduncle. Minimal amount of blood breakdown products associated with the remote right hemispheric infarcts (and along the periphery of the right frontal -temporal lobe). Subsequent mild dilation right lateral ventricle.  Remote anterior inferior left frontal lobe infarct and encephalomalacia.  Global atrophy with ventricular prominence felt to be related to atrophy and remote  infarcts.  No intracranial mass lesion noted on this unenhanced exam.  Partial opacification anterior superior ethmoid sinus air cells. Minimal mucosal thickening remainder ethmoid sinus air cells and maxillary  sinuses.  Cervical medullary junction, pituitary region, pineal region and orbital structures unremarkable.  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 The slightly decreased caliber of the right internal carotid artery may be partially explained by this configuration although result of decreased intracranial flow from prior infarcts or proximal stenosis not excluded.  Mild ectasia of the left internal carotid artery proximal to the skullbase.  Moderate to marked narrowing distal M1 segment right middle cerebral artery extending to involve the bifurcation. Mild moderate right middle cerebral artery branch vessel narrowing and irregularity.  Moderate narrowing distal M1 segment left middle cerebral artery. Marked narrowing left middle cerebral artery bifurcation. Mild to moderate narrowing left middle cerebral artery branch vessels.  Small caliber and irregularity of the A2 segment of the right anterior cerebral artery.  Ectatic vertebral arteries. Moderate to marked narrowing distal left vertebral artery.  Nonvisualized right posterior inferior cerebellar artery.  Moderate to slightly marked narrowing and irregularity proximal to mid aspect of the basilar artery.  Nonvisualized anterior inferior cerebellar arteries.  Irregular narrowed left superior cerebellar artery.  Mild to slightly moderate tandem stenosis posterior cerebral artery more notable on the right and involving distal branches.  No aneurysm identified.  IMPRESSION: Acute nonhemorrhagic left pontine infarct.  Remote infarcts, atrophy, paranasal sinus mucosal thickening and intracranial atherosclerotic type changes as detailed above.   Electronically Signed   By: Bridgett Larsson M.D.   On: 05/08/2014 09:26   Mr Maxine Glenn Head/brain Wo Cm  05/08/2014   CLINICAL DATA:  68 year old male with diabetes and high blood pressure and prior stroke presenting with 2 day history of increased slurred speech and mild confusion.  EXAM: MRI HEAD  WITHOUT CONTRAST  MRA HEAD WITHOUT CONTRAST  TECHNIQUE: Multiplanar, multiecho pulse sequences of the brain and surrounding structures were obtained without intravenous contrast. Angiographic images of the head were obtained using MRA technique without contrast.  COMPARISON:  05/07/2014 CT.  04/21/2007 MR.  FINDINGS: MRI HEAD FINDINGS  Acute nonhemorrhagic left pontine infarct.  Remote posterior right operculum region, posterior right frontal lobe and right parietal lobe infarcts. Remote infarct posterior limb right internal capsule, posterior right corona radiata and right thalamus with Wallerian degeneration and small right cerebral peduncle. Minimal amount of blood breakdown products associated with the remote right hemispheric infarcts (and along the periphery of the right frontal -temporal lobe). Subsequent mild dilation right lateral ventricle.  Remote anterior inferior left frontal lobe infarct and encephalomalacia.  Global atrophy with ventricular prominence felt to be related to atrophy and remote infarcts.  No intracranial mass lesion noted on this unenhanced exam.  Partial opacification anterior superior ethmoid sinus air cells. Minimal mucosal thickening remainder ethmoid sinus air cells and maxillary sinuses.  Cervical medullary junction, pituitary region, pineal region and orbital structures unremarkable.  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 The slightly decreased caliber of the right internal carotid artery may be partially explained by this configuration although result of decreased intracranial flow from prior infarcts or proximal stenosis not excluded.  Mild ectasia of the left internal carotid artery proximal to the skullbase.  Moderate to marked narrowing distal M1 segment right middle cerebral artery extending to involve the bifurcation. Mild moderate right middle cerebral artery branch vessel narrowing and irregularity.  Moderate narrowing distal M1 segment  left middle cerebral artery. Marked narrowing left middle cerebral artery bifurcation. Mild to moderate narrowing left middle cerebral artery branch vessels.  Small caliber and irregularity of the A2 segment of the right anterior cerebral artery.  Ectatic vertebral arteries. Moderate to marked narrowing distal left vertebral artery.  Nonvisualized right posterior inferior cerebellar artery.  Moderate to slightly marked narrowing and irregularity proximal to mid aspect of the basilar artery.  Nonvisualized anterior inferior cerebellar arteries.  Irregular narrowed left superior cerebellar artery.  Mild to slightly moderate tandem stenosis posterior cerebral artery more notable on the right and involving distal branches.  No aneurysm identified.  IMPRESSION: Acute nonhemorrhagic left pontine infarct.  Remote infarcts, atrophy, paranasal sinus mucosal thickening and intracranial atherosclerotic type changes as detailed above.   Electronically Signed   By: Bridgett Larsson M.D.   On: 05/08/2014 09:26    CBC  Recent Labs Lab 05/07/14 1941 05/07/14 2352  WBC 11.3* 10.7*  HGB 14.0 13.6  HCT 41.5 39.9  PLT 285 296  MCV 94.7 92.4  MCH 32.0 31.5  MCHC 33.7 34.1  RDW 16.7* 17.0*  LYMPHSABS 1.0  --   MONOABS 0.6  --   EOSABS 0.0  --   BASOSABS 0.0  --     Chemistries   Recent Labs Lab 05/07/14 1941 05/07/14 2352 05/09/14 0626  NA 141  --  141  K 3.9  --  4.5  CL 104  --  107  CO2 21  --  18*  GLUCOSE 100*  --  97  BUN 12  --  11  CREATININE 0.64 0.65 0.71  CALCIUM 9.4  --  8.8  AST 13  --   --   ALT 9  --   --   ALKPHOS 82  --   --   BILITOT 0.3  --   --    ------------------------------------------------------------------------------------------------------------------ estimated creatinine clearance is 101.3 ml/min (by C-G formula based on Cr of 0.71). ------------------------------------------------------------------------------------------------------------------  Recent Labs   05/08/14 0535  HGBA1C 6.1*   ------------------------------------------------------------------------------------------------------------------  Recent Labs  05/08/14 0535  CHOL 94  HDL 39*  LDLCALC 41  TRIG 69  CHOLHDL 2.4   ------------------------------------------------------------------------------------------------------------------ No results found for this basename: TSH, T4TOTAL, FREET3, T3FREE, THYROIDAB,  in the last 72 hours ------------------------------------------------------------------------------------------------------------------ No results found for this basename: VITAMINB12, FOLATE, FERRITIN, TIBC, IRON, RETICCTPCT,  in the last 72 hours  Coagulation profile No results found for this basename: INR, PROTIME,  in the last 168 hours  No results found for this basename: DDIMER,  in the last 72 hours  Cardiac Enzymes  Recent Labs Lab 05/07/14 1941  TROPONINI <0.30   ------------------------------------------------------------------------------------------------------------------ No components found with this basename: POCBNP,     Tony Johnson D.O. on 05/09/2014 at 1:32 PM  Between 7am to 7pm - Pager - [phone]  After 7pm go to [email or web address] - password [email or web address]  And look for the night coverage person covering for me after hours  [facility] Office  [phone]

## 2014-05-10 DIAGNOSIS — R1314 Dysphagia, pharyngoesophageal phase: Secondary | ICD-10-CM

## 2014-05-10 DIAGNOSIS — I639 Cerebral infarction, unspecified: Secondary | ICD-10-CM

## 2014-05-10 LAB — GLUCOSE, CAPILLARY
Glucose-Capillary: 109 mg/dL — ABNORMAL HIGH (ref 70–99)
Glucose-Capillary: 106 mg/dL — ABNORMAL HIGH (ref 70–99)
Glucose-Capillary: 144 mg/dL — ABNORMAL HIGH (ref 70–99)

## 2014-05-10 LAB — CBC
HEMATOCRIT: 36.8 % — AB (ref 39.0–52.0)
HEMOGLOBIN: 12.4 g/dL — AB (ref 13.0–17.0)
MCH: 31.4 pg (ref 26.0–34.0)
MCHC: 33.7 g/dL (ref 30.0–36.0)
MCV: 93.2 fL (ref 78.0–100.0)
Platelets: 290 10*3/uL (ref 150–400)
RBC: 3.95 MIL/uL — AB (ref 4.22–5.81)
RDW: 17.2 % — AB (ref 11.5–15.5)
WBC: 10 10*3/uL (ref 4.0–10.5)

## 2014-05-10 LAB — BASIC METABOLIC PANEL
Anion gap: 14 (ref 5–15)
BUN: 10 mg/dL (ref 6–23)
CO2: 22 meq/L (ref 19–32)
Calcium: 8.9 mg/dL (ref 8.4–10.5)
Chloride: 108 mEq/L (ref 96–112)
Creatinine, Ser: 0.69 mg/dL (ref 0.50–1.35)
GFR calc non Af Amer: >90 mL/min (ref >90)
Glucose, Bld: 107 mg/dL — ABNORMAL HIGH (ref 70–99)
POTASSIUM: 4 meq/L (ref 3.7–5.3)
Sodium: 144 mEq/L (ref 137–147)

## 2014-05-10 MED ORDER — POLYETHYLENE GLYCOL 3350 17 G PO PACK
17.0000 g | PACK | Freq: Every day | ORAL | Status: DC | PRN
  Administered 2014-05-10: 17 g via ORAL
  Filled 2014-05-10: qty 1

## 2014-05-10 MED ORDER — ASPIRIN-DIPYRIDAMOLE ER 25-200 MG PO CP12
1.0000 | ORAL_CAPSULE | Freq: Two times a day (BID) | ORAL | Status: DC
  Administered 2014-05-10 – 2014-05-11 (×3): 1 via ORAL
  Filled 2014-05-10 (×3): qty 1

## 2014-05-10 NOTE — Progress Notes (Signed)
IM ( Important Message) given to patient explaining their rights as a patient; B Jennie Hannay RN,BSN,MHA [phone] 

## 2014-05-10 NOTE — Progress Notes (Signed)
Occupational Therapy Treatment Patient Details Name: Tony Johnson MRN: [id number] DOB: 07-26-1946 Today's Date: 05/10/2014    History of present illness Pt is a 68 yo male presenting with aphasia and confusion. Admitted 7/27. MRI + for acute L pontine infarct. Pt with h/o of previous strokes with residual L spastic hemiplegia.   OT comments  Pt seen during dinner to increase independence with self-feeding. Setup plate guard and foam tubing (red) on pt's plate and spoon for increased independence. Pt able to scoop food with spoon and bring to mouth with minimal spillage. Pt required min (A) to stabilize smaller containers when scooping. VC's for smaller bites and Supervision for safety when feeding. Left adaptive equipment in room for pt to use and notified RN and NT.    Follow Up Recommendations  SNF;Supervision/Assistance - 24 hour    Equipment Recommendations  None recommended by OT       Precautions / Restrictions Precautions Precautions: Fall Restrictions Weight Bearing Restrictions: No                              Cognition   Arousal/Alertness: Awake/Alert  Behavior During Therapy: WFL for tasks assessed/performed Overall Cognitive Status: Impaired/Different from baseline Area of Impairment: Problem solving              Problem Solving: Slow processing;Difficulty sequencing;Requires verbal cues;Requires tactile cues                  Pertinent Vitals/ Pain       NAD; pt coughed twice during self-feeding causing tele box to alert and reminded pt to take smaller bites.          Frequency Min 2X/week     Progress Toward Goals  OT Goals(current goals can now be found in the care plan section)  Progress towards OT goals: Progressing toward goals  Acute Rehab OT Goals Patient Stated Goal: to work with therapy and go back home ADL Goals Pt Will Perform Eating: with min assist;with adaptive utensils;sitting Pt Will Perform Grooming: with  set-up;sitting Pt Will Perform Upper Body Bathing: with mod assist;sitting Pt Will Transfer to Toilet: with +2 assist;with min assist;bedside commode Additional ADL Goal #1: Pt will static sit EOB ~2 minutes min guard (A) with Rt Ue support pRN  Plan Discharge plan remains appropriate       End of Session    Activity Tolerance Patient tolerated treatment well   Patient Left in bed;with call bell/phone within reach;with bed alarm set; with nurse tech present to finish Supervising dinner   Nurse Communication Other (comment) (adaptive equipment for pt to use during meals for increased independence.)        Time: 1710-1733 OT Time Calculation (min): 23 min  Charges: OT General Charges $OT Visit: 1 Procedure OT Treatments $Self Care/Home Management : 23-37 mins  Rae LipsMiller, Zakkery Dorian M [phone] 05/10/2014, 5:52 PM

## 2014-05-10 NOTE — Progress Notes (Signed)
Occupational Therapy Treatment Patient Details Name: Tony Johnson MRN: [id number] DOB: August 29, 1946 Today's Date: 05/10/2014    History of present illness Pt is a 68 yo male presenting with aphasia and confusion. Admitted 7/27. MRI + for acute L pontine infarct. Pt with h/o of previous strokes with residual L spastic hemiplegia.   OT comments  Pt seen today for ADLs and functional mobility. Pt found sweating through his sheets and participated in self-care for bathing and dressing UB. Pt's temperature was normal, however his room was quite warm. Pt engaged in bed mobility to assist with changing the linens. PROM performed on pt's Lt UE and spasticity noted. Pt would continue to benefit from skilled OT and requested assistance during dinner to increase independence with self-feeding. OT will plan to return.    Follow Up Recommendations  SNF;Supervision/Assistance - 24 hour    Equipment Recommendations  None recommended by OT       Precautions / Restrictions Precautions Precautions: Fall Restrictions Weight Bearing Restrictions: No       Mobility Bed Mobility Overal bed mobility: Needs Assistance Bed Mobility: Rolling Rolling: Max assist         General bed mobility comments: Pt able to use RUE to pull on bed rail and roll to Lt side, however had increased difficulty rolling to right side due to Lt UE weakness. VC's for sequencing.   Transfers                          ADL Overall ADL's : Needs assistance/impaired     Grooming: Wash/dry face;Set up;Sitting   Upper Body Bathing: Maximal assistance;Bed level       Upper Body Dressing : Maximal assistance;Bed level                     General ADL Comments: OT arrived and pt currently sweating through sheets of bed. Pt participated in rolling and bathing/dressing UB to engage in self-care. PROM provided to Lt UE and repositioned for functionality. Pt requested assistance with dinner today to increase  self-feeding and OT will plan to return.                 Cognition  Arousal/Alertness: Awake/Alert Behavior During Therapy: WFL for tasks assessed/performed Overall Cognitive Status: Impaired/Different from baseline Area of Impairment: Problem solving              Problem Solving: Slow processing;Difficulty sequencing;Requires verbal cues;Requires tactile cues        Exercises Other Exercises Other Exercises: PROM LT UE: digits, thumb, wrist , elbow- noted spastic tone           Pertinent Vitals/ Pain       No c/o pain, NAD temperature of 98 degrees         Frequency Min 2X/week     Progress Toward Goals  OT Goals(current goals can now be found in the care plan section)  Progress towards OT goals: Progressing toward goals  Acute Rehab OT Goals Patient Stated Goal: to work with therapy and go back home ADL Goals Pt Will Perform Eating: with min assist;with adaptive utensils;sitting Pt Will Perform Grooming: with set-up;sitting Pt Will Perform Upper Body Bathing: with mod assist;sitting Pt Will Transfer to Toilet: with +2 assist;with min assist;bedside commode Additional ADL Goal #1: Pt will static sit EOB ~2 minutes min guard (A) with Rt Ue support pRN  Plan Discharge plan remains appropriate  End of Session    Activity Tolerance Patient tolerated treatment well   Patient Left in bed;with call bell/phone within reach;with bed alarm set   Nurse Communication Other (comment) (placement of call bell for pt to reach)        Time: 1620-1700 OT Time Calculation (min): 40 min  Charges: OT General Charges $OT Visit: 1 Procedure OT Treatments $Self Care/Home Management : 23-37 mins  Rae Lips [phone] 05/10/2014, 5:45 PM

## 2014-05-10 NOTE — Progress Notes (Signed)
Stroke Team Progress Note  HISTORY Tony Johnson is a 68 y.o. male with a history of previous stroke causing left hemiparesis who presents with trouble over the past couple of days of increased slurred speech and mild confusion. He also states that he has been laughing more frequently than he has in the past as well as crying easily. He states that he was in this state all day yesterday, with 7/25 prior to bedtime in last time he knows that he was his normal self. Patient was not administered TPA secondary to delay in arrival. Initially it was thought possible that a physiological stressor such as a urinary tract infection could be unmasking his previous deficits causing increased slurred speech.  He was admitted for further evaluation and treatment. MRI was positive for new stroke. He was transferred to the stroke service for consult.  SUBJECTIVE No family is at the bedside.  Overall he feels his condition is unchanged. He wants to go to rehab in the hospital; states his daughter wants to take him to [facility] for rehab.  OBJECTIVE Most recent Vital Signs: Filed Vitals:   05/10/14 0307 05/10/14 0520 05/10/14 0647 05/10/14 0945  BP: 156/77 192/60 144/63 166/64  Pulse: 52 51  62  Temp: 98.6 F (37 C) 99 F (37.2 C)  98.4 F (36.9 C)  TempSrc: Oral Oral  Oral  Resp: 18 18  18   Height:      Weight:      SpO2: 100% 100%  99%   CBG (last 3)   Recent Labs  05/09/14 1635 05/09/14 2130 05/10/14 0643  GLUCAP 100* 101* 109*    IV Fluid Intake:   . sodium chloride 75 mL/hr at 05/08/14 1218    MEDICATIONS  . aspirin  325 mg Oral Daily  . heparin  5,000 Units Subcutaneous 3 times per day  . insulin aspart  0-9 Units Subcutaneous TID WC  . ramipril  10 mg Oral Daily  . simvastatin  20 mg Oral q1800   PRN:  hydrALAZINE, polyethylene glycol, senna-docusate  Diet:  Dysphagia 2 nectar thick liquids Activity:   DVT Prophylaxis:  Heparin 5000 units sq tid   CLINICALLY SIGNIFICANT  STUDIES Basic Metabolic Panel:   Recent Labs Lab 05/09/14 0626 05/10/14 0713  NA 141 144  K 4.5 4.0  CL 107 108  CO2 18* 22  GLUCOSE 97 107*  BUN 11 10  CREATININE 0.71 0.69  CALCIUM 8.8 8.9   Liver Function Tests:   Recent Labs Lab 05/07/14 1941  AST 13  ALT 9  ALKPHOS 82  BILITOT 0.3  PROT 7.4  ALBUMIN 4.3   CBC:   Recent Labs Lab 05/07/14 1941 05/07/14 2352 05/10/14 0713  WBC 11.3* 10.7* 10.0  NEUTROABS 9.7*  --   --   HGB 14.0 13.6 12.4*  HCT 41.5 39.9 36.8*  MCV 94.7 92.4 93.2  PLT 285 296 290   Coagulation: No results found for this basename: LABPROT, INR,  in the last 168 hours Cardiac Enzymes:   Recent Labs Lab 05/07/14 1941  TROPONINI <0.30   Urinalysis:   Recent Labs Lab 05/07/14 2147 05/08/14 1624  COLORURINE AMBER* YELLOW  LABSPEC 1.026 1.021  PHURINE 5.5 7.0  GLUCOSEU NEGATIVE NEGATIVE  HGBUR SMALL* NEGATIVE  BILIRUBINUR SMALL* SMALL*  KETONESUR 15* 15*  PROTEINUR 100* NEGATIVE  UROBILINOGEN 1.0 1.0  NITRITE NEGATIVE NEGATIVE  LEUKOCYTESUR NEGATIVE NEGATIVE   Lipid Panel    Component Value Date/Time   CHOL 94 05/08/2014 0535  TRIG 69 05/08/2014 0535   HDL 39* 05/08/2014 0535   CHOLHDL 2.4 05/08/2014 0535   VLDL 14 05/08/2014 0535   LDLCALC 41 05/08/2014 0535   HgbA1C  Lab Results  Component Value Date   HGBA1C 6.1* 05/08/2014    Urine Drug Screen:     Component Value Date/Time   LABOPIA NONE DETECTED 05/07/2014 2147   LABOPIA NEGATIVE 04/21/2007 0540   COCAINSCRNUR NONE DETECTED 05/07/2014 2147   COCAINSCRNUR NEGATIVE 04/21/2007 0540   LABBENZ NONE DETECTED 05/07/2014 2147   LABBENZ NEGATIVE 04/21/2007 0540   AMPHETMU NONE DETECTED 05/07/2014 2147   AMPHETMU NEGATIVE 04/21/2007 0540   THCU NONE DETECTED 05/07/2014 2147   LABBARB NONE DETECTED 05/07/2014 2147    Alcohol Level:   Recent Labs Lab 05/07/14 1941  ETH <11    CT of the brain  05/07/2014    No acute intracranial abnormality. Stable atrophy and chronic white  matter disease. Stable lacunar infarcts as described above. No definite acute cortical infarction.     MRI of the brain  05/08/2014    Acute nonhemorrhagic left pontine infarct.  Remote infarcts, atrophy, paranasal sinus mucosal thickening   MRA of the brain  05/08/2014    intracranial atherosclerotic type changes   Carotid Doppler  No evidence of hemodynamically significant internal carotid artery stenosis. Vertebral artery flow is antegrade.   2D Echocardiogram  EF 65-70% with no source of embolus.   CXR  05/08/2014    Normal chest.     EKG  normal sinus rhythm. For complete results please see formal report.   Therapy Recommendations SNF  Physical Exam   Frail elderly african [place] male not in distress.Awake alert. Afebrile. Head is nontraumatic. Neck is supple without bruit. Hearing is normal. Cardiac exam no murmur or gallop. Lungs are clear to auscultation. Distal pulses are well felt. Neurological Exam : awake alert oriented x 2. Diminished attention and [email or web address].mild dysarthria with pseudobulbar  Speech. Brisk jaw jerk. Eye movements full but saccadic dysmetria.left lower face weal. Tongue midline. Spastic left hemiplegia with 0/5 strength and normal strength on right. Left plantar upgoing and right downgoing. Normal sensation. Coordination normal on right and not testable on left. Gait not tested. ASSESSMENT Tony Johnson is a 68 y.o. 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 Imaging confirms a left pontine infarct. Infarct felt to be secondary to small vessel disease.  On dipyridamole SR 250 mg/aspirin 25 mg orally ONCE a day prior to admission. Now on aspirin 325 mg orally every day for secondary stroke prevention. Patient with resultant pseudobulbar symptoms, right hemiparesis, dysphagia, dysarthira. Stroke work up completed.  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 Cigarette smoker, advised to stop smoking   Hospital day # 3  TREATMENT/PLAN  Increase Aggrenox 1 tab daily to Aggrenox bid to  for secondary stroke prevention.He apparently was taking it once daily only.  SNF recommended at d/c. Pt [facility] That is agreeable to stroke team.  No further stroke workup indicated.  Patient has a 10-15% risk of having another stroke over the next year, the highest risk is within  2 weeks of the most recent stroke/TIA   Ongoing risk factor control by Primary Care Physician  Stroke Service will sign off. Please call should any needs arise.  Follow up with Dr. Roda Shutters, Stroke Clinic, in 2 months.  Annie Main, MSN, RN, ANVP-BC, ANP-BC, [facility] Pager: [phone] 05/10/2014 10:34 AM I have personally examined this patient, reviewed notes, independently viewed imaging studies, participated in medical decision making and plan of care. I have made any additions or clarifications directly to the above note. Agree with note above. 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 After hours, contact General Neurology

## 2014-05-10 NOTE — Progress Notes (Addendum)
[facility]                                                                              Patient Demographics  Tony Johnson, is a 68 y.o. male, DOB - [birth year], RUE:[id number]  Admit date - 05/07/2014   Admitting Physician Doree AlbeeSteven Newton, MD  Outpatient Primary MD for the patient is No primary provider on file.  LOS - 3   Chief Complaint  Patient presents with  . Aphasia      HPI on 05/07/2014 This is a 68 y.o. year old male with significant past medical history of CVA with baseline left sided hemiparesis, type 2 diabetes mellitus, hyperlipidemia presenting with aphasia. Patient stated that he had worsening aphasia and difficulty speaking 2 days before admission. Has also had worsening left sided weakness. Denied any chest pain, shortness of breath, nausea or vomiting. Stated that he has baseline urinary incontinence on a regular basis, which has worsened. On arrival to ER, patient was afebrile. SBPs 150s-190s. Satting >96% on RA. WBC 11.3, Hgb 14, Cr 0.64, Glu 100. Head CT negative for any acute intracranial abnormalities. Stable Right lacunar infarct. UA preliminarily not indicative of UTI.   Assessment & Plan   Acute left pontine infarct -Found on MRI -Patient has had CVA with residual left-sided weakness, and was on Aggrenox prior to admission -Echocardiogram his EF 65-70% with no source of emboli -Carotid Doppler 1-39% internal carotid artery stenosis bilaterally. Vertebral arteries are patent with antegrade flow. -LDL 41, hemoglobin A1c 6.1 -PT and OT consulted and recommended skilled nursing facility -Neurology consulted and recommended dipyridamole SR 200 mg/aspirin 25 mg orally twice a day for secondary stroke prevention -Will discontinue aspirin 325 mg daily, but continue statin -Spoke with daughter regarding SNF placement, [place] but understands that we cannot assist with that and therefore is ok having the patient admitted to SNF  here.  Suspected Urinary tract infection -Patient was placed on IV Rocephin, however UA negative for infections x2 -Antibiotics discontinued  Diabetes mellitus type 2 -Hemoglobin A1c 6.1 -Continue [place] [facility] monitoring  Hypertension -Continue ramipril and PRN hydralazine  Hyperlipidemia -Continue statin  Code Status: Full  Family Communication:  None at bedside, Daughter via phone.   Disposition Plan: Admitted, pending placement.   Time Spent in minutes   30  minutes  Procedures  Carotid Doppler 1-39% internal carotid artery stenosis bilaterally. Vertebral arteries are patent with antegrade flow.  Echocardiogram Study Conclusions - Left ventricle: The cavity size was normal. Wall thickness was increased in a pattern of mild LVH. Systolic function was vigorous. The estimated ejection fraction was in the range of 65% to 70%. Wall motion was normal; there were no regional wall motion abnormalities.  - Aortic valve: Sclerosis without stenosis. There was no significant regurgitation. - Impressions: No cardiac source of embolism was identified, but cannot be ruled out on the basis of this examination. Impressions: - No cardiac source of embolism was identified, but cannot be ruled out on the basis of this examination.  Consults   Neurology  DVT Prophylaxis  Heparin  Lab Results  Component Value Date   PLT 290 05/10/2014    Medications  Scheduled Meds: . aspirin  325 mg Oral Daily  . heparin  5,000 Units Subcutaneous 3 times per day  . insulin aspart  0-9 Units Subcutaneous TID WC  . ramipril  10 mg Oral Daily  . simvastatin  20 mg Oral q1800   Continuous Infusions: . sodium chloride 75 mL/hr at 05/08/14 1218   PRN Meds:.hydrALAZINE, polyethylene glycol, senna-docusate  Antibiotics    Anti-infectives   Start     Dose/Rate Route Frequency Ordered Stop   05/07/14 2330  cefTRIAXone (ROCEPHIN) 1 g in dextrose 5 % 50 mL IVPB  Status:  Discontinued     1 g 100  mL/hr over 30 Minutes Intravenous Every 24 hours 05/07/14 2317 05/09/14 1349       Subjective:   Tony Morn seen and examined today.  Patient continues to have slurred speech.  Patient denies any new weakness at this time he does state that his left arm and leg are always weak, due to the previous stroke. Patient does complain of laughing and crying easily.  He currently denies any headache, vision changes, chest pain, shortness of breath, abdominal pain.  Objective:   Filed Vitals:   05/10/14 0307 05/10/14 0520 05/10/14 0647 05/10/14 0945  BP: 156/77 192/60 144/63 166/64  Pulse: 52 51  62  Temp: 98.6 F (37 C) 99 F (37.2 C)  98.4 F (36.9 C)  TempSrc: Oral Oral  Oral  Resp: 18 18  18   Height:      Weight:      SpO2: 100% 100%  99%    Wt Readings from Last 3 Encounters:  05/08/14 87.59 kg (193 lb 1.6 oz)  09/22/10 94.802 kg (209 lb)  03/24/10 93.668 kg (206 lb 8 oz)     Intake/Output Summary (Last 24 hours) at 05/10/14 1346 Last data filed at 05/10/14 0842  Gross per 24 hour  Intake    354 ml  Output    600 ml  Net   -246 ml    Exam  General: Well developed, well nourished, NAD, appears stated age  HEENT: NCAT, mucous membranes moist.   Cardiovascular: S1 S2 auscultated, no rubs, murmurs or gallops. Regular rate and rhythm.  Respiratory: Clear to auscultation bilaterally with equal chest rise  Abdomen: Soft, nontender, nondistended, + bowel sounds  Extremities: warm dry without cyanosis clubbing or edema  Neuro: AAOx3,Left sided hemiparesis, RUE/RLE 4/5 strength, no new focal deficits  Skin: Without rashes exudates or nodules  Psych: Normal affect and demeanor with intact judgement and insight  Data Review   Micro Results Recent Results (from the past 240 hour(s))  URINE CULTURE     Status: None   Collection Time    05/07/14  9:47 PM      Result Value Ref Range Status   Specimen Description URINE, RANDOM   Final   Special Requests NONE   Final    Culture  Setup Time     Final   Value: 05/08/2014 00:55     Performed at [facility] Count     Final   Value: NO GROWTH     Performed at [facility]   Culture     Final   Value: NO GROWTH     Performed at [facility]   Report Status 05/09/2014 FINAL   Final  CULTURE, BLOOD (ROUTINE X 2)     Status: None   Collection Time    05/07/14 11:46 PM  Result Value Ref Range Status   Specimen Description BLOOD RIGHT FOREARM   Final   Special Requests BOTTLES DRAWN AEROBIC AND ANAEROBIC 10CC EA   Final   Culture  Setup Time     Final   Value: 05/08/2014 08:31     Performed at [facility]   Culture     Final   Value:        BLOOD CULTURE RECEIVED NO GROWTH TO DATE CULTURE WILL BE HELD FOR 5 DAYS BEFORE ISSUING A FINAL NEGATIVE REPORT     Performed at [facility]   Report Status PENDING   Incomplete  CULTURE, BLOOD (ROUTINE X 2)     Status: None   Collection Time    05/07/14 11:52 PM      Result Value Ref Range Status   Specimen Description BLOOD RIGHT HAND   Final   Special Requests BOTTLES DRAWN AEROBIC ONLY 6CC   Final   Culture  Setup Time     Final   Value: 05/08/2014 08:31     Performed at [facility]   Culture     Final   Value:        BLOOD CULTURE RECEIVED NO GROWTH TO DATE CULTURE WILL BE HELD FOR 5 DAYS BEFORE ISSUING A FINAL NEGATIVE REPORT     Performed at [facility]   Report Status PENDING   Incomplete  URINE CULTURE     Status: None   Collection Time    05/08/14  4:24 PM      Result Value Ref Range Status   Specimen Description URINE, RANDOM   Final   Special Requests NONE   Final   Culture  Setup Time     Final   Value: 05/08/2014 20:52     Performed at [facility] Count     Final   Value: NO GROWTH     Performed at [facility]   Culture     Final   Value: NO GROWTH     Performed at [facility]   Report Status 05/09/2014 FINAL   Final     Radiology Reports Dg Chest 2 View  05/08/2014   CLINICAL DATA:  Stroke today.  Left-sided weakness.  EXAM: CHEST  2 VIEW  COMPARISON:  07/12/2009  FINDINGS: The heart size and mediastinal contours are within normal limits. Both lungs are clear. The visualized skeletal structures are unremarkable.  IMPRESSION: Normal chest.   Electronically Signed   By: Geanie Cooley M.D.   On: 05/08/2014 01:26   Ct Head Wo Contrast  05/07/2014   CLINICAL DATA:  Slurred speech  EXAM: CT HEAD WITHOUT CONTRAST  TECHNIQUE: Contiguous axial images were obtained from the base of the skull through the vertex without intravenous contrast.  COMPARISON:  07/12/2009  FINDINGS: No skull fracture is noted. Paranasal sinuses and mastoid air cells are unremarkable. Stable atrophy and chronic white matter disease. Stable lacunar infarcts in right parietal lobe and frontal lobe. A lacunar infarct in right internal capsule again noted. No intracranial hemorrhage, mass effect or midline shift. No definite acute cortical infarction. No mass lesion is noted on this unenhanced scan.  IMPRESSION: No acute intracranial abnormality. Stable atrophy and chronic white matter disease. Stable lacunar infarcts as described above. No definite acute cortical infarction.   Electronically Signed   By: Natasha Mead M.D.   On: 05/07/2014 20:05   Mr Brain Wo Contrast  05/08/2014  CLINICAL DATA:  67 year old male with diabetes and high blood pressure and prior stroke presenting with 2 day history of increased slurred speech and mild confusion.  EXAM: MRI HEAD WITHOUT CONTRAST  MRA HEAD WITHOUT CONTRAST  TECHNIQUE: Multiplanar, multiecho pulse sequences of the brain and surrounding structures were obtained without intravenous contrast. Angiographic images of the head were obtained using MRA technique without contrast.  COMPARISON:  05/07/2014 CT.  04/21/2007 MR.  FINDINGS: MRI HEAD FINDINGS  Acute nonhemorrhagic left pontine infarct.  Remote posterior right  operculum region, posterior right frontal lobe and right parietal lobe infarcts. Remote infarct posterior limb right internal capsule, posterior right corona radiata and right thalamus with Wallerian degeneration and small right cerebral peduncle. Minimal amount of blood breakdown products associated with the remote right hemispheric infarcts (and along the periphery of the right frontal -temporal lobe). Subsequent mild dilation right lateral ventricle.  Remote anterior inferior left frontal lobe infarct and encephalomalacia.  Global atrophy with ventricular prominence felt to be related to atrophy and remote infarcts.  No intracranial mass lesion noted on this unenhanced exam.  Partial opacification anterior superior ethmoid sinus air cells. Minimal mucosal thickening remainder ethmoid sinus air cells and maxillary sinuses.  Cervical medullary junction, pituitary region, pineal region and orbital structures unremarkable.  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 The slightly decreased caliber of the right internal carotid artery may be partially explained by this configuration although result of decreased intracranial flow from prior infarcts or proximal stenosis not excluded.  Mild ectasia of the left internal carotid artery proximal to the skullbase.  Moderate to marked narrowing distal M1 segment right middle cerebral artery extending to involve the bifurcation. Mild moderate right middle cerebral artery branch vessel narrowing and irregularity.  Moderate narrowing distal M1 segment left middle cerebral artery. Marked narrowing left middle cerebral artery bifurcation. Mild to moderate narrowing left middle cerebral artery branch vessels.  Small caliber and irregularity of the A2 segment of the right anterior cerebral artery.  Ectatic vertebral arteries. Moderate to marked narrowing distal left vertebral artery.  Nonvisualized right posterior inferior cerebellar artery.  Moderate to  slightly marked narrowing and irregularity proximal to mid aspect of the basilar artery.  Nonvisualized anterior inferior cerebellar arteries.  Irregular narrowed left superior cerebellar artery.  Mild to slightly moderate tandem stenosis posterior cerebral artery more notable on the right and involving distal branches.  No aneurysm identified.  IMPRESSION: Acute nonhemorrhagic left pontine infarct.  Remote infarcts, atrophy, paranasal sinus mucosal thickening and intracranial atherosclerotic type changes as detailed above.   Electronically Signed   By: Bridgett Larsson M.D.   On: 05/08/2014 09:26   Mr Maxine Glenn Head/brain Wo Cm  05/08/2014   CLINICAL DATA:  68 year old male with diabetes and high blood pressure and prior stroke presenting with 2 day history of increased slurred speech and mild confusion.  EXAM: MRI HEAD WITHOUT CONTRAST  MRA HEAD WITHOUT CONTRAST  TECHNIQUE: Multiplanar, multiecho pulse sequences of the brain and surrounding structures were obtained without intravenous contrast. Angiographic images of the head were obtained using MRA technique without contrast.  COMPARISON:  05/07/2014 CT.  04/21/2007 MR.  FINDINGS: MRI HEAD FINDINGS  Acute nonhemorrhagic left pontine infarct.  Remote posterior right operculum region, posterior right frontal lobe and right parietal lobe infarcts. Remote infarct posterior limb right internal capsule, posterior right corona radiata and right thalamus with Wallerian degeneration and small right cerebral peduncle. Minimal amount of blood breakdown products associated with the remote right  hemispheric infarcts (and along the periphery of the right frontal -temporal lobe). Subsequent mild dilation right lateral ventricle.  Remote anterior inferior left frontal lobe infarct and encephalomalacia.  Global atrophy with ventricular prominence felt to be related to atrophy and remote infarcts.  No intracranial mass lesion noted on this unenhanced exam.  Partial opacification anterior  superior ethmoid sinus air cells. Minimal mucosal thickening remainder ethmoid sinus air cells and maxillary sinuses.  Cervical medullary junction, pituitary region, pineal region and orbital structures unremarkable.  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 The slightly decreased caliber of the right internal carotid artery may be partially explained by this configuration although result of decreased intracranial flow from prior infarcts or proximal stenosis not excluded.  Mild ectasia of the left internal carotid artery proximal to the skullbase.  Moderate to marked narrowing distal M1 segment right middle cerebral artery extending to involve the bifurcation. Mild moderate right middle cerebral artery branch vessel narrowing and irregularity.  Moderate narrowing distal M1 segment left middle cerebral artery. Marked narrowing left middle cerebral artery bifurcation. Mild to moderate narrowing left middle cerebral artery branch vessels.  Small caliber and irregularity of the A2 segment of the right anterior cerebral artery.  Ectatic vertebral arteries. Moderate to marked narrowing distal left vertebral artery.  Nonvisualized right posterior inferior cerebellar artery.  Moderate to slightly marked narrowing and irregularity proximal to mid aspect of the basilar artery.  Nonvisualized anterior inferior cerebellar arteries.  Irregular narrowed left superior cerebellar artery.  Mild to slightly moderate tandem stenosis posterior cerebral artery more notable on the right and involving distal branches.  No aneurysm identified.  IMPRESSION: Acute nonhemorrhagic left pontine infarct.  Remote infarcts, atrophy, paranasal sinus mucosal thickening and intracranial atherosclerotic type changes as detailed above.   Electronically Signed   By: Bridgett Larsson M.D.   On: 05/08/2014 09:26    CBC  Recent Labs Lab 05/07/14 1941 05/07/14 2352 05/10/14 0713  WBC 11.3* 10.7* 10.0  HGB 14.0 13.6  12.4*  HCT 41.5 39.9 36.8*  PLT 285 296 290  MCV 94.7 92.4 93.2  MCH 32.0 31.5 31.4  MCHC 33.7 34.1 33.7  RDW 16.7* 17.0* 17.2*  LYMPHSABS 1.0  --   --   MONOABS 0.6  --   --   EOSABS 0.0  --   --   BASOSABS 0.0  --   --     Chemistries   Recent Labs Lab 05/07/14 1941 05/07/14 2352 05/09/14 0626 05/10/14 0713  NA 141  --  141 144  K 3.9  --  4.5 4.0  CL 104  --  107 108  CO2 21  --  18* 22  GLUCOSE 100*  --  97 107*  BUN 12  --  11 10  CREATININE 0.64 0.65 0.71 0.69  CALCIUM 9.4  --  8.8 8.9  AST 13  --   --   --   ALT 9  --   --   --   ALKPHOS 82  --   --   --   BILITOT 0.3  --   --   --    ------------------------------------------------------------------------------------------------------------------ estimated creatinine clearance is 101.3 ml/min (by C-G formula based on Cr of 0.69). ------------------------------------------------------------------------------------------------------------------  Recent Labs  05/08/14 0535  HGBA1C 6.1*   ------------------------------------------------------------------------------------------------------------------  Recent Labs  05/08/14 0535  CHOL 94  HDL 39*  LDLCALC 41  TRIG 69  CHOLHDL 2.4   ------------------------------------------------------------------------------------------------------------------ No results found for this basename:  TSH, T4TOTAL, FREET3, T3FREE, THYROIDAB,  in the last 72 hours ------------------------------------------------------------------------------------------------------------------ No results found for this basename: VITAMINB12, FOLATE, FERRITIN, TIBC, IRON, RETICCTPCT,  in the last 72 hours  Coagulation profile No results found for this basename: INR, PROTIME,  in the last 168 hours  No results found for this basename: DDIMER,  in the last 72 hours  Cardiac Enzymes  Recent Labs Lab 05/07/14 1941  TROPONINI <0.30    ------------------------------------------------------------------------------------------------------------------ No components found with this basename: POCBNP,     Jazlyne Gauger D.O. on 05/10/2014 at 1:46 PM  Between 7am to 7pm - Pager - [phone]  After 7pm go to [email or web address] - password [email or web address]  And look for the night coverage person covering for me after hours  [facility] Office  [phone]

## 2014-05-10 NOTE — Progress Notes (Addendum)
Assisting CSW provided pt with bed offers and informed him that he could be discharging sometime soon (as relayed to CSW.) Pt stated he was not sure which facility he wanted however [facility] because it is the closest to his home. Pt consented for CSW to speak to his daughter. CSW has left a message for daughter.   *[facility] has [facility] once [facility].   Theresia BoughNorma Zaiden Ludlum, MSW, LCSW [phone]

## 2014-05-11 LAB — GLUCOSE, CAPILLARY
Glucose-Capillary: 104 mg/dL — ABNORMAL HIGH (ref 70–99)
Glucose-Capillary: 155 mg/dL — ABNORMAL HIGH (ref 70–99)

## 2014-05-11 MED ORDER — POLYETHYLENE GLYCOL 3350 17 G PO PACK: 17.0000 g | PACK | Freq: Every day | ORAL | Status: DC | PRN

## 2014-05-11 MED ORDER — ASPIRIN-DIPYRIDAMOLE ER 25-200 MG PO CP12: 1.0000 | ORAL_CAPSULE | Freq: Two times a day (BID) | ORAL | Status: AC

## 2014-05-11 MED ORDER — SENNOSIDES-DOCUSATE SODIUM 8.6-50 MG PO TABS: 1.0000 | ORAL_TABLET | Freq: Every evening | ORAL | Status: DC | PRN

## 2014-05-11 NOTE — Progress Notes (Signed)
[facility].  Patient condom catheter, IV , and telemetry removed.  [place]  Lance BoschAnna Deshay Blumenfeld. RN

## 2014-05-11 NOTE — Progress Notes (Signed)
Physical Therapy Treatment Patient Details Name: Angelene GiovanniDarrell W Nimmons MRN: [id number] DOB: April 09, 1946 Today's Date: 05/11/2014    History of Present Illness Pt is a [age] yo male presenting with aphasia and confusion. Admitted 7/27. MRI + for acute L pontine infarct. Pt with h/o of previous strokes with residual L spastic hemiplegia.    PT Comments    Patient making slow gains with mobility.  Agree with need for SNF for continued therapy at discharge.  Follow Up Recommendations  SNF;Supervision/Assistance - 24 hour     Equipment Recommendations  None recommended by PT    Recommendations for Other Services       Precautions / Restrictions Precautions Precautions: Fall Restrictions Weight Bearing Restrictions: No    Mobility  Bed Mobility Overal bed mobility: Needs Assistance;+2 for physical assistance Bed Mobility: Rolling;Sidelying to Sit;Sit to Sidelying Rolling: Max assist Sidelying to sit: Max assist;+2 for physical assistance     Sit to sidelying: Max assist;+2 for physical assistance General bed mobility comments: Verbal and tactile cues for mobility technique.  Assist to flex RLE and reach for rail with LUE.  Noted increased extensor tone in RLE.  Required +2 assist to move trunk to sitting position.  Once upright, patient initially required max assist to maintain balance.  Worked in sitting x 12 minutes, focusing on reaching a midline upright position.  Patient with Lt shoulder elevated and rotated forward.  Trunk flexed and leaning to left.  Patient able to maintain balance x 20 seconds with min guard assist using RUE for support.  Patient with loss of balance with any activity such as turning head, lifting RUE, or shifting weight.  Returned to sidelying > supine with +2 max assist.  Transfers                    Ambulation/Gait                 Stairs            Wheelchair Mobility    Modified Rankin (Stroke Patients Only) Modified Rankin (Stroke  Patients Only) Pre-Morbid Rankin Score: Moderately severe disability Modified Rankin: Severe disability     Balance Overall balance assessment: Needs assistance Sitting-balance support: Single extremity supported;Feet supported Sitting balance-Leahy Scale: Poor Sitting balance - Comments: Worked on sitting balance/posture x 12 minutes.  See bed mobility section. Postural control: Posterior lean;Left lateral lean                          Cognition Arousal/Alertness: Awake/alert Behavior During Therapy: WFL for tasks assessed/performed Overall Cognitive Status: Impaired/Different from baseline Area of Impairment: Problem solving             Problem Solving: Slow processing;Difficulty sequencing;Requires verbal cues;Requires tactile cues      Exercises      General Comments        Pertinent Vitals/Pain     Home Living                      Prior Function            PT Goals (current goals can now be found in the care plan section) Progress towards PT goals: Progressing toward goals    Frequency  Min 3X/week    PT Plan Current plan remains appropriate    Co-evaluation             End of Session   Activity Tolerance: Patient  tolerated treatment well Patient left: in bed;with call bell/phone within reach;with bed alarm set     Time: 8295-6213 PT Time Calculation (min): 23 min  Charges:  $Therapeutic Activity: 23-37 mins                    G Codes:      Vena Austria 06-06-14, 2:47 PM Durenda Hurt. Renaldo Fiddler, [facility] Acute Rehab Services Pager [phone]

## 2014-05-11 NOTE — Discharge Summary (Signed)
Physician Discharge Summary  Tony Johnson:[id number] DOB: 29-Jan-1946 DOA: 05/07/2014  PCP: No primary provider on file.  Admit date: 05/07/2014 Discharge date: 05/11/2014  Time spent: 45 minutes  Recommendations for Outpatient Follow-up:  Patient will be discharged to [facility] health care. He should continue taking his medications as prescribed. Patient should continue physical as well as occupational rehabilitation as recommended by the facility. Patient should continue a dysphagia/heart healthy and carb modified diet. Patient should follow up with his primary care physician within one week of discharge. Patient should also follow up with Dr. Roda Shutters in 4 weeks of discharge.  Discharge Diagnoses:  Acute CVA/left pontine infarct Suspected urinary tract infection Diabetes mellitus type 2 Hypertension Hyperlipidemia  Discharge Condition: Stable  Diet recommendation: Dysphagia 2/ Heart healthy/modified  Filed Weights   05/08/14 0136  Weight: 87.59 kg (193 lb 1.6 oz)    History of present illness:  on 05/07/2014  This is a 68 y.o. year old male with significant past medical history of CVA with baseline left sided hemiparesis, type 2 diabetes mellitus, hyperlipidemia presenting with aphasia. Patient stated that he had worsening aphasia and difficulty speaking 2 days before admission. Has also had worsening left sided weakness. Denied any chest pain, shortness of breath, nausea or vomiting. Stated that he has baseline urinary incontinence on a regular basis, which has worsened. On arrival to ER, patient was afebrile. SBPs 150s-190s. Satting >96% on RA. WBC 11.3, Hgb 14, Cr 0.64, Glu 100. Head CT negative for any acute intracranial abnormalities. Stable Right lacunar infarct. UA preliminarily not indicative of UTI.   Hospital Course:  Acute left pontine infarct  -Found on MRI  -Patient has had CVA with residual left-sided weakness, and was on Aggrenox prior to admission  -Echocardiogram  his EF 65-70% with no source of emboli  -Carotid Doppler 1-39% internal carotid artery stenosis bilaterally. Vertebral arteries are patent with antegrade flow.  -LDL 41, hemoglobin A1c 6.1  -PT and OT consulted and recommended skilled nursing facility  -Neurology consulted and recommended dipyridamole SR 200 mg/aspirin 25 mg orally twice a day for secondary stroke prevention  -Continue Aggrenox twice daily and statin. -Spoke with daughter regarding SNF placement, she would like take him to [place], but understands that we cannot assist with that and therefore is ok having the patient admitted to SNF here.   Suspected Urinary tract infection  -Patient was placed on IV Rocephin, however UA negative for infections x2  -Antibiotics discontinued   Diabetes mellitus type 2  -Hemoglobin A1c 6.1  -Continue ISS and CBG monitoring   Hypertension  -Continue ramipril    Hyperlipidemia  -Continue statin  Tobacco abuse -Smoking cessation counseling given  Procedures  Carotid Doppler 1-39% internal carotid artery stenosis bilaterally. Vertebral arteries are patent with antegrade flow.   Echocardiogram  Study Conclusions - Left ventricle: The cavity size was normal. Wall thickness was increased in a pattern of mild LVH. Systolic function was vigorous. The estimated ejection fraction was in the range of 65% to 70%. Wall motion was normal; there were no regional wall motion abnormalities.  - Aortic valve: Sclerosis without stenosis. There was no significant regurgitation. - Impressions: No cardiac source of embolism was identified, but cannot be ruled out on the basis of this examination. Impressions: - No cardiac source of embolism was identified, but cannot be ruled out on the basis of this examination.  Consults  Neurology   Discharge Exam: Filed Vitals:   05/11/14 1037  BP: 167/79  Pulse: 70  Temp: 98.7 F (37.1 C)  Resp: 18   Exam  General: Well developed, well nourished, NAD,  appears stated age  HEENT: NCAT, mucous membranes moist.  Cardiovascular: S1 S2 auscultated, Regular rate and rhythm.  Respiratory: Clear to auscultation bilaterally with equal chest rise  Abdomen: Soft, nontender, nondistended, + bowel sounds  Extremities: warm dry without cyanosis clubbing or edema  Neuro: AAOx3, slurred speech, no new focal deficits  Skin: Without rashes exudates or nodules  Psych: Normal affect and demeanor    Discharge Instructions      Discharge Instructions   Discharge instructions    Complete by:  As directed   Patient will be discharged to [facility] health care. He should continue taking his medications as prescribed. Patient should continue physical as well as occupational rehabilitation as recommended by the facility. Patient should continue a dysphagia/heart healthy and carb modified diet. Patient should follow up with his primary care physician within one week of discharge. Patient should also follow up with Dr. Roda Shutters in 4 weeks of discharge.            Medication List         dipyridamole-aspirin 200-25 MG per 12 hr capsule  Commonly known as:  AGGRENOX  Take 1 capsule by mouth 2 (two) times daily.     ibuprofen 200 MG tablet  Commonly known as:  ADVIL,MOTRIN  Take 400 mg by mouth every 6 (six) hours as needed for headache or moderate pain.     metFORMIN 1000 MG tablet  Commonly known as:  GLUCOPHAGE  Take 1,000 mg by mouth 2 (two) times daily with a meal.     polyethylene glycol packet  Commonly known as:  MIRALAX / GLYCOLAX  Take 17 g by mouth daily as needed for mild constipation or moderate constipation.     pravastatin 40 MG tablet  Commonly known as:  PRAVACHOL  Take 40 mg by mouth daily.     ramipril 10 MG capsule  Commonly known as:  ALTACE  Take 10 mg by mouth daily.     senna-docusate 8.6-50 MG per tablet  Commonly known as:  Senokot-S  Take 1 tablet by mouth at bedtime as needed for mild constipation.       No Known  Allergies Follow-up Information   Follow up with Xu,Jindong, MD. Schedule an appointment as soon as possible for a visit in 1 month. ([facility], Dr. Roda Shutters is a stroke MD and Dr. Marlis Edelson partner)    Specialty:  Neurology   Contact information:   [address] [place] [address] [phone]       Follow up with primary care physician. Schedule an appointment as soon as possible for a visit in 1 week. [facility] Followup)        The results of significant diagnostics from this hospitalization (including imaging, microbiology, ancillary and laboratory) are listed below for reference.    Significant Diagnostic Studies: Dg Chest 2 View  05/08/2014   CLINICAL DATA:  Stroke today.  Left-sided weakness.  EXAM: CHEST  2 VIEW  COMPARISON:  07/12/2009  FINDINGS: The heart size and mediastinal contours are within normal limits. Both lungs are clear. The visualized skeletal structures are unremarkable.  IMPRESSION: Normal chest.   Electronically Signed   By: Geanie Cooley M.D.   On: 05/08/2014 01:26   Ct Head Wo Contrast  05/07/2014   CLINICAL DATA:  Slurred speech  EXAM: CT HEAD WITHOUT CONTRAST  TECHNIQUE: Contiguous axial images were  obtained from the base of the skull through the vertex without intravenous contrast.  COMPARISON:  07/12/2009  FINDINGS: No skull fracture is noted. Paranasal sinuses and mastoid air cells are unremarkable. Stable atrophy and chronic white matter disease. Stable lacunar infarcts in right parietal lobe and frontal lobe. A lacunar infarct in right internal capsule again noted. No intracranial hemorrhage, mass effect or midline shift. No definite acute cortical infarction. No mass lesion is noted on this unenhanced scan.  IMPRESSION: No acute intracranial abnormality. Stable atrophy and chronic white matter disease. Stable lacunar infarcts as described above. No definite acute cortical infarction.   Electronically Signed   By: Natasha Mead M.D.   On: 05/07/2014  20:05   Mr Brain Wo Contrast  05/08/2014   CLINICAL DATA:  68 year old male with diabetes and high blood pressure and prior stroke presenting with 2 day history of increased slurred speech and mild confusion.  EXAM: MRI HEAD WITHOUT CONTRAST  MRA HEAD WITHOUT CONTRAST  TECHNIQUE: Multiplanar, multiecho pulse sequences of the brain and surrounding structures were obtained without intravenous contrast. Angiographic images of the head were obtained using MRA technique without contrast.  COMPARISON:  05/07/2014 CT.  04/21/2007 MR.  FINDINGS: MRI HEAD FINDINGS  Acute nonhemorrhagic left pontine infarct.  Remote posterior right operculum region, posterior right frontal lobe and right parietal lobe infarcts. Remote infarct posterior limb right internal capsule, posterior right corona radiata and right thalamus with Wallerian degeneration and small right cerebral peduncle. Minimal amount of blood breakdown products associated with the remote right hemispheric infarcts (and along the periphery of the right frontal -temporal lobe). Subsequent mild dilation right lateral ventricle.  Remote anterior inferior left frontal lobe infarct and encephalomalacia.  Global atrophy with ventricular prominence felt to be related to atrophy and remote infarcts.  No intracranial mass lesion noted on this unenhanced exam.  Partial opacification anterior superior ethmoid sinus air cells. Minimal mucosal thickening remainder ethmoid sinus air cells and maxillary sinuses.  Cervical medullary junction, pituitary region, pineal region and orbital structures unremarkable.  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 The slightly decreased caliber of the right internal carotid artery may be partially explained by this configuration although result of decreased intracranial flow from prior infarcts or proximal stenosis not excluded.  Mild ectasia of the left internal carotid artery proximal to the skullbase.  Moderate to  marked narrowing distal M1 segment right middle cerebral artery extending to involve the bifurcation. Mild moderate right middle cerebral artery branch vessel narrowing and irregularity.  Moderate narrowing distal M1 segment left middle cerebral artery. Marked narrowing left middle cerebral artery bifurcation. Mild to moderate narrowing left middle cerebral artery branch vessels.  Small caliber and irregularity of the A2 segment of the right anterior cerebral artery.  Ectatic vertebral arteries. Moderate to marked narrowing distal left vertebral artery.  Nonvisualized right posterior inferior cerebellar artery.  Moderate to slightly marked narrowing and irregularity proximal to mid aspect of the basilar artery.  Nonvisualized anterior inferior cerebellar arteries.  Irregular narrowed left superior cerebellar artery.  Mild to slightly moderate tandem stenosis posterior cerebral artery more notable on the right and involving distal branches.  No aneurysm identified.  IMPRESSION: Acute nonhemorrhagic left pontine infarct.  Remote infarcts, atrophy, paranasal sinus mucosal thickening and intracranial atherosclerotic type changes as detailed above.   Electronically Signed   By: Bridgett Larsson M.D.   On: 05/08/2014 09:26   Mr Maxine Glenn Head/brain Wo Cm  05/08/2014   CLINICAL DATA:  68 year old male with diabetes and high blood pressure and prior stroke presenting with 2 day history of increased slurred speech and mild confusion.  EXAM: MRI HEAD WITHOUT CONTRAST  MRA HEAD WITHOUT CONTRAST  TECHNIQUE: Multiplanar, multiecho pulse sequences of the brain and surrounding structures were obtained without intravenous contrast. Angiographic images of the head were obtained using MRA technique without contrast.  COMPARISON:  05/07/2014 CT.  04/21/2007 MR.  FINDINGS: MRI HEAD FINDINGS  Acute nonhemorrhagic left pontine infarct.  Remote posterior right operculum region, posterior right frontal lobe and right parietal lobe infarcts.  Remote infarct posterior limb right internal capsule, posterior right corona radiata and right thalamus with Wallerian degeneration and small right cerebral peduncle. Minimal amount of blood breakdown products associated with the remote right hemispheric infarcts (and along the periphery of the right frontal -temporal lobe). Subsequent mild dilation right lateral ventricle.  Remote anterior inferior left frontal lobe infarct and encephalomalacia.  Global atrophy with ventricular prominence felt to be related to atrophy and remote infarcts.  No intracranial mass lesion noted on this unenhanced exam.  Partial opacification anterior superior ethmoid sinus air cells. Minimal mucosal thickening remainder ethmoid sinus air cells and maxillary sinuses.  Cervical medullary junction, pituitary region, pineal region and orbital structures unremarkable.  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 The slightly decreased caliber of the right internal carotid artery may be partially explained by this configuration although result of decreased intracranial flow from prior infarcts or proximal stenosis not excluded.  Mild ectasia of the left internal carotid artery proximal to the skullbase.  Moderate to marked narrowing distal M1 segment right middle cerebral artery extending to involve the bifurcation. Mild moderate right middle cerebral artery branch vessel narrowing and irregularity.  Moderate narrowing distal M1 segment left middle cerebral artery. Marked narrowing left middle cerebral artery bifurcation. Mild to moderate narrowing left middle cerebral artery branch vessels.  Small caliber and irregularity of the A2 segment of the right anterior cerebral artery.  Ectatic vertebral arteries. Moderate to marked narrowing distal left vertebral artery.  Nonvisualized right posterior inferior cerebellar artery.  Moderate to slightly marked narrowing and irregularity proximal to mid aspect of the basilar  artery.  Nonvisualized anterior inferior cerebellar arteries.  Irregular narrowed left superior cerebellar artery.  Mild to slightly moderate tandem stenosis posterior cerebral artery more notable on the right and involving distal branches.  No aneurysm identified.  IMPRESSION: Acute nonhemorrhagic left pontine infarct.  Remote infarcts, atrophy, paranasal sinus mucosal thickening and intracranial atherosclerotic type changes as detailed above.   Electronically Signed   By: Bridgett Larsson M.D.   On: 05/08/2014 09:26    Microbiology: Recent Results (from the past 240 hour(s))  URINE CULTURE     Status: None   Collection Time    05/07/14  9:47 PM      Result Value Ref Range Status   Specimen Description URINE, RANDOM   Final   Special Requests NONE   Final   Culture  Setup Time     Final   Value: 05/08/2014 00:55     Performed at [facility] Count     Final   Value: NO GROWTH     Performed at [facility]   Culture     Final   Value: NO GROWTH     Performed at [facility]   Report Status 05/09/2014 FINAL   Final  CULTURE, BLOOD (ROUTINE X 2)  Status: None   Collection Time    05/07/14 11:46 PM      Result Value Ref Range Status   Specimen Description BLOOD RIGHT FOREARM   Final   Special Requests BOTTLES DRAWN AEROBIC AND ANAEROBIC 10CC EA   Final   Culture  Setup Time     Final   Value: 05/08/2014 08:31     [facility]   Culture     Final   Value:        BLOOD CULTURE RECEIVED NO GROWTH TO DATE CULTURE WILL BE HELD FOR 5 DAYS BEFORE ISSUING A FINAL NEGATIVE REPORT     [facility]formed at [facility]   Report Status PENDING   Incomplete  CULTURE, BLOOD (ROUTINE X 2)     Status: None   Collection Time    05/07/14 11:52 PM      Result Value Ref Range Status   Specimen Description BLOOD RIGHT HAND   Final   Special Requests BOTTLES DRAWN AEROBIC ONLY 6CC   Final   Culture  Setup Time     Final   Value: 05/08/2014 08:31      [facility]formed at [facility]   Culture     Final   Value:        BLOOD CULTURE RECEIVED NO GROWTH TO DATE CULTURE WILL BE HELD FOR 5 DAYS BEFORE ISSUING A FINAL NEGATIVE REPORT     [facility]formed at [facility]   Report Status PENDING   Incomplete  URINE CULTURE     Status: None   Collection Time    05/08/14  4:24 PM      Result Value Ref Range Status   Specimen Description URINE, RANDOM   Final   Special Requests NONE   Final   Culture  Setup Time     Final   Value: 05/08/2014 20:52     [facility]formed at [facility]   [place] Count     Final   Value: NO GROWTH     [facility]formed at [facility]   Culture     Final   Value: NO GROWTH     Performed at [facility]   Report Status 05/09/2014 FINAL   Final     Labs: Basic Metabolic Panel:  Recent Labs Lab 05/07/14 1941 05/07/14 2352 05/09/14 0626 05/10/14 0713  NA 141  --  141 144  K 3.9  --  4.5 4.0  CL 104  --  107 108  CO2 21  --  18* 22  GLUCOSE 100*  --  97 107*  BUN 12  --  11 10  CREATININE 0.64 0.65 0.71 0.69  CALCIUM 9.4  --  8.8 8.9   Liver Function Tests:  Recent Labs Lab 05/07/14 1941  AST 13  ALT 9  ALKPHOS 82  BILITOT 0.3  PROT 7.4  ALBUMIN 4.3   No results found for this basename: LIPASE, AMYLASE,  in the last 168 hours  Recent Labs Lab 05/07/14 2358  AMMONIA 28   CBC:  Recent Labs Lab 05/07/14 1941 05/07/14 2352 05/10/14 0713  WBC 11.3* 10.7* 10.0  NEUTROABS 9.7*  --   --   HGB 14.0 13.6 12.4*  HCT 41.5 39.9 36.8*  MCV 94.7 92.4 93.2  PLT 285 296 290   Cardiac Enzymes:  Recent Labs Lab 05/07/14 1941  TROPONINI <0.30   BNP: BNP (last 3 results) No results found for this basename: PROBNP,  in the  last 8760 hours CBG:  Recent Labs Lab 05/09/14 2130 05/10/14 0643 05/10/14 1217 05/10/14 2214 05/11/14 0631  GLUCAP 101* 109* 106* 144* 104*       Signed:  Keelan Tripodi  [facility] 05/11/2014, 11:40 AM

## 2014-05-14 LAB — CULTURE, BLOOD (ROUTINE X 2)
CULTURE: NO GROWTH
Culture: NO GROWTH

## 2014-05-20 ENCOUNTER — Emergency Department (HOSPITAL_COMMUNITY)
Admission: EM | Admit: 2014-05-20 | Discharge: 2014-05-21 | Disposition: A | Discharge status: Home / Self Care | Payer: Medicare Other | Attending: Emergency Medicine | Admitting: Emergency Medicine

## 2014-05-20 ENCOUNTER — Encounter (HOSPITAL_COMMUNITY): Payer: Self-pay | Admitting: Emergency Medicine

## 2014-05-20 DIAGNOSIS — Z79899 Other long term (current) drug therapy: Secondary | ICD-10-CM | POA: Insufficient documentation

## 2014-05-20 DIAGNOSIS — Z7982 Long term (current) use of aspirin: Secondary | ICD-10-CM | POA: Insufficient documentation

## 2014-05-20 DIAGNOSIS — I10 Essential (primary) hypertension: Secondary | ICD-10-CM | POA: Insufficient documentation

## 2014-05-20 DIAGNOSIS — E119 Type 2 diabetes mellitus without complications: Secondary | ICD-10-CM | POA: Insufficient documentation

## 2014-05-20 DIAGNOSIS — G8190 Hemiplegia, unspecified affecting unspecified side: Secondary | ICD-10-CM | POA: Insufficient documentation

## 2014-05-20 DIAGNOSIS — Z791 Long term (current) use of non-steroidal anti-inflammatories (NSAID): Secondary | ICD-10-CM | POA: Insufficient documentation

## 2014-05-20 DIAGNOSIS — F17200 Nicotine dependence, unspecified, uncomplicated: Secondary | ICD-10-CM | POA: Insufficient documentation

## 2014-05-20 NOTE — ED Notes (Signed)
Pt monitored by pulse ox, bp cuff, and 5-lead. 

## 2014-05-20 NOTE — Discharge Instructions (Signed)
How to Take Your Blood Pressure °HOW DO I GET A BLOOD PRESSURE MACHINE? °· You can buy an electronic home blood pressure machine at your local pharmacy. Insurance will sometimes cover the cost if you have a prescription. °· Ask your doctor what type of machine is best for you. There are different machines for your arm and your wrist. °· If you decide to buy a machine to check your blood pressure on your arm, first check the size of your arm so you can buy the right size cuff. To check the size of your arm:   °· Use a measuring tape that shows both inches and centimeters.   °· Wrap the measuring tape around the upper-middle part of your arm. You may need someone to help you measure.   °· Write down your arm measurement in both inches and centimeters.   °· To measure your blood pressure correctly, it is important to have the right size cuff.   °· If your arm is up to 13 inches (up to 34 centimeters), get an adult cuff size. °· If your arm is 13 to 17 inches (35 to 44 centimeters), get a large adult cuff size.   °·  If your arm is 17 to 20 inches (45 to 52 centimeters), get an adult thigh cuff.   °WHAT DO THE NUMBERS MEAN?  °· There are two numbers that make up your blood pressure. For example: 120/80. °· The first number (120 in our example) is called the "systolic pressure." It is a measure of the pressure in your blood vessels when your heart is pumping blood. °· The second number (80 in our example) is called the "diastolic pressure." It is a measure of the pressure in your blood vessels when your heart is resting between beats. °· Your doctor will tell you what your blood pressure should be. °WHAT SHOULD I DO BEFORE I CHECK MY BLOOD PRESSURE?  °· Try to rest or relax for at least 30 minutes before you check your blood pressure. °· Do not smoke. °· Do not have any drinks with caffeine, such as: °· Soda. °· Coffee. °· Tea. °· Check your blood pressure in a quiet room. °· Sit down and stretch out your arm on a table.  Keep your arm at about the level of your heart. Let your arm relax. °· Make sure that your legs are not crossed. °HOW DO I CHECK MY BLOOD PRESSURE? °· Follow the directions that came with your machine. °· Make sure you remove any tight-fighting clothing from your arm or wrist. Wrap the cuff around your upper arm or wrist. You should be able to fit a finger between the cuff and your arm. If you cannot fit a finger between the cuff and your arm, it is too tight and should be removed and rewrapped. °· Some units require you to manually pump up the arm cuff. °· Automatic units inflate the cuff when you press a button. °· Cuff deflation is automatic in both models. °· After the cuff is inflated, the unit measures your blood pressure and pulse. The readings are shown on a monitor. Hold still and breathe normally while the cuff is inflated. °· Getting a reading takes less than a minute. °· Some models store readings in a memory. Some provide a printout of readings. If your machine does not store your readings, keep a written record. °· Take readings with you to your next visit with your doctor. °Document Released: 09/10/2008 Document Revised: 02/12/2014 Document Reviewed: 11/23/2013 °[employer]® Patient Information ©  2015 [employer]. This information is not intended to replace advice given to you by your health care provider. Make sure you discuss any questions you have with your health care provider. Make sure to give this patient his medications as scheduled on a regular basis   On arrival to the ED patient had no complaints, his blood pressure is 140/67

## 2014-05-20 NOTE — ED Notes (Signed)
Pt at skilled nursing facility who reported to medics that pt had elevated bp >200 systolic tonight at 1800 pt without complaints pt did receive scheduled catapress 0.1 mg at 2100 pt has no complaints

## 2014-05-20 NOTE — ED Provider Notes (Signed)
CSN: [id number]     Arrival date & time 05/20/14  2236 History   First MD Initiated Contact with Patient 05/20/14 2245     Chief Complaint  Patient presents with  . Hypertension     (Consider location/radiation/quality/duration/timing/severity/associated sxs/prior Treatment) HPI Comments: This is a patient in a rehab unit post CVA, tonigh when vital signs were taken at [facility] his blood pressure was found to be elevated  >200  He was given his normal PM medications.  EMS was called 4 hours later and patient's BP was found to lower.  Patient has not complaints of SOB, CP, visual disturbances, new weakness, headache.  Has residual L spastic hemiparesis   Patient is a 68 y.o. male presenting with hypertension. The history is provided by the nursing home.  Hypertension This is a recurrent problem. The current episode started today. The problem occurs daily. The problem has been resolved. Pertinent negatives include no abdominal pain, anorexia, change in bowel habit, chest pain, coughing, diaphoresis, fatigue, headaches, nausea, numbness, sore throat, vertigo, visual change, vomiting or weakness. Nothing aggravates the symptoms. Treatments tried: antihypertensive medication  The treatment provided significant relief.    Past Medical History  Diagnosis Date  . Diabetes mellitus   . Hypertension   . CVA (cerebral infarction)     hx of   Past Surgical History  Procedure Laterality Date  . Ganglion cyst excision      s/p removal - wrist in 1965- 07/12/04   Family History  Problem Relation Age of Onset  . Cancer Mother     breast cancer  . Diabetes Father    History  Substance Use Topics  . Smoking status: Current Every Day Smoker  . Smokeless tobacco: Not on file  . Alcohol Use: No    Review of Systems  Constitutional: Negative for diaphoresis and fatigue.  HENT: Negative for sore throat.   Eyes: Negative for visual disturbance.  Respiratory: Negative for cough.   Cardiovascular:  Negative for chest pain.  Gastrointestinal: Negative for nausea, vomiting, abdominal pain, anorexia and change in bowel habit.  Neurological: Negative for dizziness, vertigo, weakness, numbness and headaches.  All other systems reviewed and are negative.     Allergies  Review of patient's allergies indicates no known allergies.  Home Medications   Prior to Admission medications   Medication Sig Start Date End Date Taking? Authorizing Provider  dipyridamole-aspirin (AGGRENOX) 200-25 MG per 12 hr capsule Take 1 capsule by mouth 2 (two) times daily. 05/11/14  Yes Maryann Mikhail, DO  ibuprofen (ADVIL,MOTRIN) 200 MG tablet Take 200 mg by mouth every 6 (six) hours as needed for headache or moderate pain.    Yes Historical Provider, MD  lisinopril (PRINIVIL,ZESTRIL) 10 MG tablet Take 10 mg by mouth daily.   Yes Historical Provider, MD  metFORMIN (GLUCOPHAGE) 1000 MG tablet Take 1,000 mg by mouth 2 (two) times daily with a meal.   Yes Historical Provider, MD  polyethylene glycol (MIRALAX / GLYCOLAX) packet Take 17 g by mouth daily as needed for mild constipation or moderate constipation. 05/11/14  Yes Maryann Mikhail, DO  pravastatin (PRAVACHOL) 40 MG tablet Take 40 mg by mouth every morning.    Yes Historical Provider, MD  senna-docusate (SENOKOT-S) 8.6-50 MG per tablet Take 1 tablet by mouth at bedtime as needed for mild constipation. 05/11/14  Yes Maryann Mikhail, DO   BP 140/67  Pulse 78  Temp(Src) 99 F (37.2 C) (Oral)  Resp 18  Ht 6' (1.829 m)  [employer]  200 lb (90.719 kg)  BMI 27.12 kg/m2  SpO2 96% Physical Exam  Nursing note and vitals reviewed. Constitutional: He is oriented to person, place, and time. He appears well-developed and well-nourished. No distress.  HENT:  Head: Normocephalic.  Mouth/Throat: Oropharynx is clear and moist.  Eyes: Pupils are equal, round, and reactive to light.  Neck: Normal range of motion.  Cardiovascular: Normal rate and regular rhythm.    Pulmonary/Chest: Effort normal and breath sounds normal. No respiratory distress. He has no wheezes.  Abdominal: Soft. He exhibits no distension. There is no tenderness.  Musculoskeletal: He exhibits no tenderness.  Neurological: He is alert and oriented to person, place, and time.  L hemiparesis  Skin: Skin is warm. No rash noted.    ED Course  Procedures (including critical care time) Labs Review Labs Reviewed - No data to display  Imaging Review No results found.   EKG Interpretation None      MDM   Final diagnoses:  None      no new symptoms patients BP 140/67 will be DC back to his facility with instructions to get his medications at scheduled times on a regular basis     Arman FilterGail K Inger Wiest, NP 05/20/14 2323

## 2014-05-21 NOTE — ED Provider Notes (Signed)
Medical screening examination/treatment/procedure(s) were performed by non-physician practitioner and as supervising physician I was immediately available for consultation/collaboration.   EKG Interpretation None        Brystal Kildow, MD 05/21/14 0619 

## 2014-06-05 ENCOUNTER — Emergency Department (HOSPITAL_COMMUNITY): Payer: Medicare Other

## 2014-06-05 ENCOUNTER — Encounter (HOSPITAL_COMMUNITY): Payer: Self-pay | Admitting: Emergency Medicine

## 2014-06-05 ENCOUNTER — Inpatient Hospital Stay (HOSPITAL_COMMUNITY)
Admission: EM | Admit: 2014-06-05 | Discharge: 2014-06-08 | DRG: 683 | Disposition: A | Discharge status: Skilled Nursing Facility | Payer: Medicare Other | Attending: Internal Medicine | Admitting: Internal Medicine

## 2014-06-05 DIAGNOSIS — Z993 Dependence on wheelchair: Secondary | ICD-10-CM

## 2014-06-05 DIAGNOSIS — W19XXXA Unspecified fall, initial encounter: Secondary | ICD-10-CM | POA: Diagnosis present

## 2014-06-05 DIAGNOSIS — I10 Essential (primary) hypertension: Secondary | ICD-10-CM | POA: Diagnosis present

## 2014-06-05 DIAGNOSIS — I69920 Aphasia following unspecified cerebrovascular disease: Secondary | ICD-10-CM

## 2014-06-05 DIAGNOSIS — E119 Type 2 diabetes mellitus without complications: Secondary | ICD-10-CM | POA: Diagnosis present

## 2014-06-05 DIAGNOSIS — E870 Hyperosmolality and hypernatremia: Secondary | ICD-10-CM | POA: Diagnosis present

## 2014-06-05 DIAGNOSIS — E872 Acidosis, unspecified: Secondary | ICD-10-CM | POA: Diagnosis present

## 2014-06-05 DIAGNOSIS — N179 Acute kidney failure, unspecified: Principal | ICD-10-CM | POA: Diagnosis present

## 2014-06-05 DIAGNOSIS — R197 Diarrhea, unspecified: Secondary | ICD-10-CM | POA: Diagnosis present

## 2014-06-05 DIAGNOSIS — R5381 Other malaise: Secondary | ICD-10-CM | POA: Diagnosis present

## 2014-06-05 DIAGNOSIS — R5383 Other fatigue: Secondary | ICD-10-CM

## 2014-06-05 DIAGNOSIS — I69959 Hemiplegia and hemiparesis following unspecified cerebrovascular disease affecting unspecified side: Secondary | ICD-10-CM | POA: Diagnosis not present

## 2014-06-05 LAB — BLOOD GAS, ARTERIAL
Acid-base deficit: 7.2 mmol/L — ABNORMAL HIGH (ref 0.0–2.0)
Bicarbonate: 15.3 mEq/L — ABNORMAL LOW (ref 20.0–24.0)
Drawn by: 31814
FIO2: 0.21 %
O2 SAT: 96.3 %
Patient temperature: 100.7
TCO2: 13.2 mmol/L (ref 0–100)
pCO2 arterial: 26.4 mmHg — ABNORMAL LOW (ref 35.0–45.0)
pH, Arterial: 7.389 (ref 7.350–7.450)
pO2, Arterial: 100 mmHg (ref 80.0–100.0)

## 2014-06-05 LAB — COMPREHENSIVE METABOLIC PANEL
ALK PHOS: 89 U/L (ref 39–117)
ALT: 10 U/L (ref 0–53)
AST: 14 U/L (ref 0–37)
Albumin: 4.1 g/dL (ref 3.5–5.2)
Anion gap: 21 — ABNORMAL HIGH (ref 5–15)
BILIRUBIN TOTAL: 0.4 mg/dL (ref 0.3–1.2)
BUN: 113 mg/dL — ABNORMAL HIGH (ref 6–23)
CALCIUM: 9.7 mg/dL (ref 8.4–10.5)
CHLORIDE: 120 meq/L — AB (ref 96–112)
CO2: 17 mEq/L — ABNORMAL LOW (ref 19–32)
Creatinine, Ser: 4.11 mg/dL — ABNORMAL HIGH (ref 0.50–1.35)
GFR calc non Af Amer: 14 mL/min — ABNORMAL LOW (ref >90)
GFR, EST AFRICAN AMERICAN: 16 mL/min — AB (ref >90)
GLUCOSE: 132 mg/dL — AB (ref 70–99)
Potassium: 5 mEq/L (ref 3.7–5.3)
SODIUM: 158 meq/L — AB (ref 137–147)
Total Protein: 7.9 g/dL (ref 6.0–8.3)

## 2014-06-05 LAB — CBC WITH DIFFERENTIAL/PLATELET
BASOS ABS: 0 10*3/uL (ref 0.0–0.1)
Basophils Relative: 0 % (ref 0–1)
Eosinophils Absolute: 0 10*3/uL (ref 0.0–0.7)
Eosinophils Relative: 0 % (ref 0–5)
HEMATOCRIT: 45.9 % (ref 39.0–52.0)
Hemoglobin: 15.5 g/dL (ref 13.0–17.0)
LYMPHS ABS: 2.7 10*3/uL (ref 0.7–4.0)
LYMPHS PCT: 15 % (ref 12–46)
MCH: 32.8 pg (ref 26.0–34.0)
MCHC: 33.8 g/dL (ref 30.0–36.0)
MCV: 97.2 fL (ref 78.0–100.0)
Monocytes Absolute: 1.1 10*3/uL — ABNORMAL HIGH (ref 0.1–1.0)
Monocytes Relative: 7 % (ref 3–12)
NEUTROS ABS: 13.5 10*3/uL — AB (ref 1.7–7.7)
Neutrophils Relative %: 78 % — ABNORMAL HIGH (ref 43–77)
PLATELETS: 308 10*3/uL (ref 150–400)
RBC: 4.72 MIL/uL (ref 4.22–5.81)
RDW: 16 % — AB (ref 11.5–15.5)
WBC: 17.3 10*3/uL — AB (ref 4.0–10.5)

## 2014-06-05 LAB — URINALYSIS, ROUTINE W REFLEX MICROSCOPIC
GLUCOSE, UA: NEGATIVE mg/dL
Hgb urine dipstick: NEGATIVE
KETONES UR: NEGATIVE mg/dL
Leukocytes, UA: NEGATIVE
Nitrite: NEGATIVE
PROTEIN: NEGATIVE mg/dL
Specific Gravity, Urine: 1.024 (ref 1.005–1.030)
UROBILINOGEN UA: 0.2 mg/dL (ref 0.0–1.0)
pH: 5 (ref 5.0–8.0)

## 2014-06-05 LAB — CBG MONITORING, ED: GLUCOSE-CAPILLARY: 130 mg/dL — AB (ref 70–99)

## 2014-06-05 LAB — I-STAT CG4 LACTIC ACID, ED: Lactic Acid, Venous: 2.44 mmol/L — ABNORMAL HIGH (ref 0.5–2.2)

## 2014-06-05 MED ORDER — SODIUM CHLORIDE 0.9 % IV SOLN
INTRAVENOUS | Status: DC
  Administered 2014-06-05: 23:00:00 via INTRAVENOUS

## 2014-06-05 MED ORDER — SODIUM CHLORIDE 0.9 % IV SOLN
INTRAVENOUS | Status: DC
Start: 2014-06-05 — End: 2014-06-06

## 2014-06-05 NOTE — ED Provider Notes (Signed)
CSN: [id number]     Arrival date & time 06/05/14  1935 History   First MD Initiated Contact with Patient 06/05/14 2014     Chief Complaint  Patient presents with  . Weakness     (Consider location/radiation/quality/duration/timing/severity/associated sxs/prior Treatment) HPI Comments: Patient from nursing home with generalized weakness and fatigue over the past days with decreased by mouth intake. He is wheelchair-bound from previous stroke and has left-sided weakness and has expressive aphasia. He answers yes and no questions. He is oriented x2. He denies any pain. Staff reports he slipped out of his wheelchair to the floor but did not injure himself. He said poor by mouth intake without any vomiting. Denies cough, congestion, sore throat, abdominal pain or chest pain. Denies dysuria hematuria. Temp 100.7 on arrival  The history is provided by the patient and the EMS personnel. The history is limited by the condition of the patient.    Past Medical History  Diagnosis Date  . Diabetes mellitus   . Hypertension   . CVA (cerebral infarction)     hx of   Past Surgical History  Procedure Laterality Date  . Ganglion cyst excision      s/p removal - wrist in 1965- 07/12/04   Family History  Problem Relation Age of Onset  . Cancer Mother     breast cancer  . Diabetes Father    History  Substance Use Topics  . Smoking status: Current Every Day Smoker  . Smokeless tobacco: Not on file  . Alcohol Use: No    Review of Systems  Unable to perform ROS: Patient nonverbal  Neurological: Positive for weakness.      Allergies  Review of patient's allergies indicates no known allergies.  Home Medications   Prior to Admission medications   Medication Sig Start Date End Date Taking? Authorizing Provider  cloNIDine (CATAPRES) 0.1 MG tablet Take 0.1 mg by mouth every 12 (twelve) hours as needed (essential hypertension.).   Yes Historical Provider, MD  dipyridamole-aspirin (AGGRENOX)  200-25 MG per 12 hr capsule Take 1 capsule by mouth 2 (two) times daily. 05/11/14  Yes Maryann Mikhail, DO  ibuprofen (ADVIL,MOTRIN) 200 MG tablet Take 200 mg by mouth every 6 (six) hours as needed for headache or moderate pain.    Yes Historical Provider, MD  lisinopril (PRINIVIL,ZESTRIL) 10 MG tablet Take 10 mg by mouth daily.   Yes Historical Provider, MD  metFORMIN (GLUCOPHAGE) 1000 MG tablet Take 1,000 mg by mouth 2 (two) times daily with a meal.   Yes Historical Provider, MD  nystatin (MYCOSTATIN) 100000 UNIT/ML suspension Take 5 mLs by mouth 4 (four) times daily.   Yes Historical Provider, MD  polyethylene glycol (MIRALAX / GLYCOLAX) packet Take 17 g by mouth daily as needed (constipation.).   Yes Historical Provider, MD  pravastatin (PRAVACHOL) 40 MG tablet Take 40 mg by mouth every morning.    Yes Historical Provider, MD  senna (SENOKOT) 8.6 MG TABS tablet Take 1 tablet by mouth daily as needed for mild constipation.   Yes Historical Provider, MD   BP 106/50  Pulse 113  Temp(Src) 98.1 F (36.7 C) (Oral)  Resp 22  Ht 6' (1.829 m)  Wt 164 lb 7.4 oz (74.6 kg)  BMI 22.30 kg/m2  SpO2 90% Physical Exam  Nursing note and vitals reviewed. Constitutional: He is oriented to person, place, and time. He appears well-developed and well-nourished. No distress.  HENT:  Head: Normocephalic and atraumatic.  Mouth/Throat: Oropharynx is clear and moist.  No oropharyngeal exudate.  Dry mucous membranes  Eyes: Conjunctivae and EOM are normal. Pupils are equal, round, and reactive to light.  Neck: Normal range of motion. Neck supple.  No meningismus.  Cardiovascular: Normal rate, regular rhythm, normal heart sounds and intact distal pulses.   No murmur heard. Pulmonary/Chest: Effort normal and breath sounds normal. No respiratory distress.  Abdominal: Soft. There is no tenderness. There is no rebound and no guarding.  Musculoskeletal: Normal range of motion. He exhibits no edema and no tenderness.   Neurological: He is alert and oriented to person, place, and time. No cranial nerve deficit. He exhibits normal muscle tone. Coordination normal.  Left-sided weakness at baseline  Skin: Skin is warm.  No decubitus ulcers  Psychiatric: He has a normal mood and affect. His behavior is normal.    ED Course  Procedures (including critical care time) Labs Review Labs Reviewed  CBC WITH DIFFERENTIAL - Abnormal; Notable for the following:    WBC 17.3 (*)    RDW 16.0 (*)    Neutrophils Relative % 78 (*)    Neutro Abs 13.5 (*)    Monocytes Absolute 1.1 (*)    All other components within normal limits  COMPREHENSIVE METABOLIC PANEL - Abnormal; Notable for the following:    Sodium 158 (*)    Chloride 120 (*)    CO2 17 (*)    Glucose, Bld 132 (*)    BUN 113 (*)    Creatinine, Ser 4.11 (*)    GFR calc non Af Amer 14 (*)    GFR calc Af Amer 16 (*)    Anion gap 21 (*)    All other components within normal limits  URINALYSIS, ROUTINE W REFLEX MICROSCOPIC - Abnormal; Notable for the following:    Color, Urine AMBER (*)    APPearance CLOUDY (*)    Bilirubin Urine MODERATE (*)    All other components within normal limits  BLOOD GAS, ARTERIAL - Abnormal; Notable for the following:    pCO2 arterial 26.4 (*)    Bicarbonate 15.3 (*)    Acid-base deficit 7.2 (*)    All other components within normal limits  CBG MONITORING, ED - Abnormal; Notable for the following:    Glucose-Capillary 130 (*)    All other components within normal limits  I-STAT CG4 LACTIC ACID, ED - Abnormal; Notable for the following:    Lactic Acid, Venous 2.44 (*)    All other components within normal limits  CULTURE, BLOOD (ROUTINE X 2)  CULTURE, BLOOD (ROUTINE X 2)  CULTURE, BLOOD (ROUTINE X 2)  MRSA PCR SCREENING    Imaging Review Dg Chest 2 View  06/05/2014   CLINICAL DATA:  Generalized weakness 2 days.  Recent fall.  EXAM: CHEST  2 VIEW  COMPARISON:  05/08/2014  FINDINGS: Lungs are adequately inflated and  otherwise clear. The cardiomediastinal silhouette is within normal. There is dislocation of the left humeral head.  IMPRESSION: No acute cardiopulmonary disease.  Dislocation of the left humeral head. Recommend left shoulder series for better evaluation.   Electronically Signed   By: Elberta Fortis M.D.   On: 06/05/2014 20:50   Ct Head Wo Contrast  06/05/2014   CLINICAL DATA:  Generalized weakness for 2 days. Fall today. Dysphasia.  EXAM: CT HEAD WITHOUT CONTRAST  CT CERVICAL SPINE WITHOUT CONTRAST  TECHNIQUE: Multidetector CT imaging of the head and cervical spine was performed following the standard protocol without intravenous contrast. Multiplanar CT image reconstructions of the cervical spine  were also generated.  COMPARISON:  MRI brain 05/08/2014.  CT head 05/07/2014.  FINDINGS: CT HEAD FINDINGS  Diffuse cerebral atrophy. Mild ventricular dilatation consistent with central atrophy. Low-attenuation changes in the deep white matter consistent with small vessel ischemia. Focal areas of encephalomalacia consistent with old infarcts in the right posterior frontal and anterior parietal region and in the left anterior inferior frontal region. Focal low-attenuation change in the pons correlating with prior infarct. No mass effect or midline shift. No abnormal extra-axial fluid collections. Gray-white matter junctions are distinct. Basal cisterns are not effaced. No evidence of acute intracranial hemorrhage. Vascular calcifications. Old nasal bone fracture/deformity. No depressed skull fractures. Paranasal sinuses are clear. No significant change since prior study.  CT CERVICAL SPINE FINDINGS  Straightening of the usual cervical lordosis which may be due to patient positioning but ligamentous injury or muscle spasm could also have this appearance. Degenerative changes throughout the cervical spine. Narrowed cervical interspaces at C5-6 and C6-7 levels with associated endplate hypertrophic changes and moderately  prominent disc osteophyte complexes posteriorly. Degenerative changes throughout the facet joints. 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 C1-2 articulation appears intact. No focal bone lesion or bone destruction. Bone cortex and trabecular architecture appears intact. No prevertebral soft tissue swelling. Diffuse homogeneous enlargement of the thyroid gland.  IMPRESSION: No acute intracranial abnormalities. Multiple old infarcts and chronic atrophy/ small vessel ischemic changes.  Degenerative changes in the cervical spine. Nonspecific straightening of the usual cervical lordosis. No displaced fractures identified.   Electronically Signed   By: Burman Nieves M.D.   On: 06/05/2014 22:19   Ct Cervical Spine Wo Contrast  06/05/2014   CLINICAL DATA:  Generalized weakness for 2 days. Fall today. Dysphasia.  EXAM: CT HEAD WITHOUT CONTRAST  CT CERVICAL SPINE WITHOUT CONTRAST  TECHNIQUE: Multidetector CT imaging of the head and cervical spine was performed following the standard protocol without intravenous contrast. Multiplanar CT image reconstructions of the cervical spine were also generated.  COMPARISON:  MRI brain 05/08/2014.  CT head 05/07/2014.  FINDINGS: CT HEAD FINDINGS  Diffuse cerebral atrophy. Mild ventricular dilatation consistent with central atrophy. Low-attenuation changes in the deep white matter consistent with small vessel ischemia. Focal areas of encephalomalacia consistent with old infarcts in the right posterior frontal and anterior parietal region and in the left anterior inferior frontal region. Focal low-attenuation change in the pons correlating with prior infarct. No mass effect or midline shift. No abnormal extra-axial fluid collections. Gray-white matter junctions are distinct. Basal cisterns are not effaced. No evidence of acute intracranial hemorrhage. Vascular calcifications. Old nasal bone fracture/deformity. No depressed skull fractures. Paranasal sinuses are clear. No  significant change since prior study.  CT CERVICAL SPINE FINDINGS  Straightening of the usual cervical lordosis which may be due to patient positioning but ligamentous injury or muscle spasm could also have this appearance. Degenerative changes throughout the cervical spine. Narrowed cervical interspaces at C5-6 and C6-7 levels with associated endplate hypertrophic changes and moderately prominent disc osteophyte complexes posteriorly. Degenerative changes throughout the facet joints. 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 C1-2 articulation appears intact. No focal bone lesion or bone destruction. Bone cortex and trabecular architecture appears intact. No prevertebral soft tissue swelling. Diffuse homogeneous enlargement of the thyroid gland.  IMPRESSION: No acute intracranial abnormalities. Multiple old infarcts and chronic atrophy/ small vessel ischemic changes.  Degenerative changes in the cervical spine. Nonspecific straightening of the usual cervical lordosis. No displaced fractures identified.   Electronically Signed   By: Burman Nieves  M.D.   On: 06/05/2014 22:19   Dg Shoulder Left  06/05/2014   CLINICAL DATA:  Left shoulder weakness.  EXAM: LEFT SHOULDER - 2+ VIEW  COMPARISON:  Chest 06/05/2014  FINDINGS: Left shoulder appears normally located. No evidence of acute fracture or dislocation currently. Subacromial space, acromioclavicular space, and coracoclavicular spaces are intact and normal. No focal bone lesion or bone destruction. Soft tissues appear unremarkable.  IMPRESSION: No acute bony abnormalities demonstrated.   Electronically Signed   By: Burman Nieves M.D.   On: 06/05/2014 22:08     EKG Interpretation None      MDM   Final diagnoses:  Acute renal failure, unspecified acute renal failure type  Hypernatremia  Metabolic acidosis   Patient from nursing home with generalized weakness and decreased by mouth intake. Febrile at 100.7. Denies cough.  Urinalysis positive for  bilirubin only. WBC 17. Chest x-ray negative. There is possible dislocation of the left humeral head though this is not appreciated on physical exam. We'll obtain shoulder x-ray.  Shoulder x-ray shows no evidence of dislocation. CT head and C-spine negative.  Patient with new renal failure with creatinine of 4 with BUN of 113. Hypernatremia of 158. Anion gap 21.  gentle hydration started.  PH normal on ABG. No CO2 retention.  Discussed with Dr. Toniann Fail who will admit patient.     Glynn Octave, MD 06/06/14 [phone]

## 2014-06-05 NOTE — ED Notes (Signed)
Per EMS: Pt from [facility]. Pt chaving generalized weakness x 2 days. Around 1600 today pt slipped from wheelchair to floor. Pt refusing food and drink for the past 24 hrs. Dysphasia at baseline per nursing home staff. Hx of diabetes.

## 2014-06-05 NOTE — ED Notes (Signed)
Pt requesting water. Pt informed that he needs to be evaluated and cleared by doctor or PA before we can give him anything to drink.

## 2014-06-05 NOTE — ED Notes (Signed)
Bed: WA02 Expected date: 06/05/14 Expected time: 7:20 PM Means of arrival: Ambulance Comments: Weakness/dehydrated

## 2014-06-06 ENCOUNTER — Encounter (HOSPITAL_COMMUNITY): Payer: Self-pay | Admitting: Internal Medicine

## 2014-06-06 ENCOUNTER — Inpatient Hospital Stay (HOSPITAL_COMMUNITY): Payer: Medicare Other

## 2014-06-06 DIAGNOSIS — E872 Acidosis, unspecified: Secondary | ICD-10-CM

## 2014-06-06 DIAGNOSIS — N179 Acute kidney failure, unspecified: Principal | ICD-10-CM

## 2014-06-06 DIAGNOSIS — E870 Hyperosmolality and hypernatremia: Secondary | ICD-10-CM | POA: Diagnosis present

## 2014-06-06 DIAGNOSIS — E119 Type 2 diabetes mellitus without complications: Secondary | ICD-10-CM

## 2014-06-06 LAB — BASIC METABOLIC PANEL
ANION GAP: 19 — AB (ref 5–15)
ANION GAP: 16 — AB (ref 5–15)
Anion gap: 23 — ABNORMAL HIGH (ref 5–15)
Anion gap: 22 — ABNORMAL HIGH (ref 5–15)
Anion gap: 15 (ref 5–15)
BUN: 113 mg/dL — AB (ref 6–23)
BUN: 113 mg/dL — ABNORMAL HIGH (ref 6–23)
BUN: 109 mg/dL — AB (ref 6–23)
BUN: 104 mg/dL — AB (ref 6–23)
BUN: 92 mg/dL — AB (ref 6–23)
CALCIUM: 8.9 mg/dL (ref 8.4–10.5)
CALCIUM: 8.9 mg/dL (ref 8.4–10.5)
CALCIUM: 8.7 mg/dL (ref 8.4–10.5)
CALCIUM: 8.5 mg/dL (ref 8.4–10.5)
CHLORIDE: 105 meq/L (ref 96–112)
CO2: 14 meq/L — AB (ref 19–32)
CO2: 15 mEq/L — ABNORMAL LOW (ref 19–32)
CO2: 15 meq/L — AB (ref 19–32)
CO2: 18 meq/L — AB (ref 19–32)
CO2: 18 mEq/L — ABNORMAL LOW (ref 19–32)
CREATININE: 4.05 mg/dL — AB (ref 0.50–1.35)
CREATININE: 3.5 mg/dL — AB (ref 0.50–1.35)
CREATININE: 3.07 mg/dL — AB (ref 0.50–1.35)
Calcium: 8.5 mg/dL (ref 8.4–10.5)
Chloride: 113 mEq/L — ABNORMAL HIGH (ref 96–112)
Chloride: 113 mEq/L — ABNORMAL HIGH (ref 96–112)
Chloride: 108 mEq/L (ref 96–112)
Chloride: 107 mEq/L (ref 96–112)
Creatinine, Ser: 4.24 mg/dL — ABNORMAL HIGH (ref 0.50–1.35)
Creatinine, Ser: 2.15 mg/dL — ABNORMAL HIGH (ref 0.50–1.35)
GFR calc Af Amer: 15 mL/min — ABNORMAL LOW (ref >90)
GFR calc Af Amer: 23 mL/min — ABNORMAL LOW (ref >90)
GFR calc Af Amer: 35 mL/min — ABNORMAL LOW (ref >90)
GFR calc non Af Amer: 14 mL/min — ABNORMAL LOW (ref >90)
GFR calc non Af Amer: 17 mL/min — ABNORMAL LOW (ref >90)
GFR calc non Af Amer: 20 mL/min — ABNORMAL LOW (ref >90)
GFR, EST AFRICAN AMERICAN: 16 mL/min — AB (ref >90)
GFR, EST AFRICAN AMERICAN: 19 mL/min — AB (ref >90)
GFR, EST NON AFRICAN AMERICAN: 13 mL/min — AB (ref >90)
GFR, EST NON AFRICAN AMERICAN: 30 mL/min — AB (ref >90)
GLUCOSE: 188 mg/dL — AB (ref 70–99)
Glucose, Bld: 176 mg/dL — ABNORMAL HIGH (ref 70–99)
Glucose, Bld: 159 mg/dL — ABNORMAL HIGH (ref 70–99)
Glucose, Bld: 156 mg/dL — ABNORMAL HIGH (ref 70–99)
Glucose, Bld: 126 mg/dL — ABNORMAL HIGH (ref 70–99)
POTASSIUM: 3.4 meq/L — AB (ref 3.7–5.3)
Potassium: 4.6 mEq/L (ref 3.7–5.3)
Potassium: 4.6 mEq/L (ref 3.7–5.3)
Potassium: 3.7 mEq/L (ref 3.7–5.3)
Potassium: 3.6 mEq/L — ABNORMAL LOW (ref 3.7–5.3)
SODIUM: 138 meq/L (ref 137–147)
Sodium: 150 mEq/L — ABNORMAL HIGH (ref 137–147)
Sodium: 150 mEq/L — ABNORMAL HIGH (ref 137–147)
Sodium: 142 mEq/L (ref 137–147)
Sodium: 141 mEq/L (ref 137–147)

## 2014-06-06 LAB — GLUCOSE, CAPILLARY
GLUCOSE-CAPILLARY: 164 mg/dL — AB (ref 70–99)
GLUCOSE-CAPILLARY: 148 mg/dL — AB (ref 70–99)
GLUCOSE-CAPILLARY: 117 mg/dL — AB (ref 70–99)
Glucose-Capillary: 147 mg/dL — ABNORMAL HIGH (ref 70–99)
Glucose-Capillary: 148 mg/dL — ABNORMAL HIGH (ref 70–99)

## 2014-06-06 LAB — CBC WITH DIFFERENTIAL/PLATELET
BASOS ABS: 0 10*3/uL (ref 0.0–0.1)
Basophils Relative: 0 % (ref 0–1)
EOS PCT: 0 % (ref 0–5)
Eosinophils Absolute: 0 10*3/uL (ref 0.0–0.7)
HCT: 42 % (ref 39.0–52.0)
Hemoglobin: 13.9 g/dL (ref 13.0–17.0)
Lymphocytes Relative: 16 % (ref 12–46)
Lymphs Abs: 2.6 10*3/uL (ref 0.7–4.0)
MCH: 32.3 pg (ref 26.0–34.0)
MCHC: 33.1 g/dL (ref 30.0–36.0)
MCV: 97.4 fL (ref 78.0–100.0)
Monocytes Absolute: 1 10*3/uL (ref 0.1–1.0)
Monocytes Relative: 7 % (ref 3–12)
NEUTROS ABS: 12.3 10*3/uL — AB (ref 1.7–7.7)
Neutrophils Relative %: 77 % (ref 43–77)
Platelets: 269 10*3/uL (ref 150–400)
RBC: 4.31 MIL/uL (ref 4.22–5.81)
RDW: 16 % — AB (ref 11.5–15.5)
WBC: 15.9 10*3/uL — ABNORMAL HIGH (ref 4.0–10.5)

## 2014-06-06 LAB — CK: Total CK: 488 U/L — ABNORMAL HIGH (ref 7–232)

## 2014-06-06 LAB — MRSA PCR SCREENING: MRSA by PCR: NEGATIVE

## 2014-06-06 LAB — HEPATIC FUNCTION PANEL
ALT: 11 U/L (ref 0–53)
AST: 20 U/L (ref 0–37)
Albumin: 3.6 g/dL (ref 3.5–5.2)
Alkaline Phosphatase: 78 U/L (ref 39–117)
BILIRUBIN TOTAL: 0.3 mg/dL (ref 0.3–1.2)
Total Protein: 7 g/dL (ref 6.0–8.3)

## 2014-06-06 LAB — TSH: TSH: 1.27 u[IU]/mL (ref 0.350–4.500)

## 2014-06-06 LAB — OCCULT BLOOD X 1 CARD TO LAB, STOOL: FECAL OCCULT BLD: NEGATIVE

## 2014-06-06 LAB — SODIUM, URINE, RANDOM: SODIUM UR: 35 meq/L

## 2014-06-06 LAB — CREATININE, URINE, RANDOM: Creatinine, Urine: 309.27 mg/dL

## 2014-06-06 LAB — TROPONIN I: Troponin I: <0.3 ng/mL (ref <0.30)

## 2014-06-06 MED ORDER — HEPARIN SODIUM (PORCINE) 5000 UNIT/ML IJ SOLN
5000.0000 [IU] | Freq: Three times a day (TID) | INTRAMUSCULAR | Status: DC
  Administered 2014-06-06 (×2): 5000 [IU] via SUBCUTANEOUS
  Filled 2014-06-06 (×5): qty 1

## 2014-06-06 MED ORDER — ASPIRIN-DIPYRIDAMOLE ER 25-200 MG PO CP12
1.0000 | ORAL_CAPSULE | Freq: Two times a day (BID) | ORAL | Status: DC
  Administered 2014-06-06 – 2014-06-08 (×4): 1 via ORAL
  Filled 2014-06-06 (×6): qty 1

## 2014-06-06 MED ORDER — ONDANSETRON HCL 4 MG/2ML IJ SOLN: 4.0000 mg | Freq: Four times a day (QID) | INTRAMUSCULAR | Status: DC | PRN

## 2014-06-06 MED ORDER — ACETAMINOPHEN 325 MG PO TABS
650.0000 mg | ORAL_TABLET | Freq: Four times a day (QID) | ORAL | Status: DC | PRN
  Administered 2014-06-07: 650 mg via ORAL
  Filled 2014-06-06: qty 2

## 2014-06-06 MED ORDER — SENNA 8.6 MG PO TABS: 1.0000 | ORAL_TABLET | Freq: Every day | ORAL | Status: DC | PRN

## 2014-06-06 MED ORDER — SIMVASTATIN 20 MG PO TABS
20.0000 mg | ORAL_TABLET | Freq: Every day | ORAL | Status: DC
Start: 2014-06-06 — End: 2014-06-08
  Administered 2014-06-06 – 2014-06-07 (×2): 20 mg via ORAL
  Filled 2014-06-06 (×3): qty 1

## 2014-06-06 MED ORDER — SODIUM CHLORIDE 0.9 % IJ SOLN: 3.0000 mL | Freq: Two times a day (BID) | INTRAMUSCULAR | Status: DC

## 2014-06-06 MED ORDER — SODIUM CHLORIDE 0.9 % IV SOLN
INTRAVENOUS | Status: DC
  Administered 2014-06-06 – 2014-06-08 (×3): via INTRAVENOUS

## 2014-06-06 MED ORDER — ACETAMINOPHEN 650 MG RE SUPP: 650.0000 mg | Freq: Four times a day (QID) | RECTAL | Status: DC | PRN

## 2014-06-06 MED ORDER — ONDANSETRON HCL 4 MG PO TABS: 4.0000 mg | ORAL_TABLET | Freq: Four times a day (QID) | ORAL | Status: DC | PRN

## 2014-06-06 MED ORDER — DEXTROSE 5 % IV SOLN
INTRAVENOUS | Status: DC
  Administered 2014-06-06 (×2): via INTRAVENOUS

## 2014-06-06 MED ORDER — POLYETHYLENE GLYCOL 3350 17 G PO PACK
17.0000 g | PACK | Freq: Every day | ORAL | Status: DC | PRN
  Filled 2014-06-06: qty 1

## 2014-06-06 MED ORDER — INSULIN ASPART 100 UNIT/ML ~~LOC~~ SOLN
0.0000 [IU] | Freq: Three times a day (TID) | SUBCUTANEOUS | Status: DC
  Administered 2014-06-06: 2 [IU] via SUBCUTANEOUS
  Administered 2014-06-06: 1 [IU] via SUBCUTANEOUS
  Administered 2014-06-07: 2 [IU] via SUBCUTANEOUS
  Administered 2014-06-08: 1 [IU] via SUBCUTANEOUS

## 2014-06-06 MED ORDER — CLONIDINE HCL 0.1 MG PO TABS
0.1000 mg | ORAL_TABLET | Freq: Two times a day (BID) | ORAL | Status: DC | PRN
  Filled 2014-06-06: qty 1

## 2014-06-06 NOTE — Progress Notes (Signed)
Patient had a very large loose stool, with some red noted on the bed linens that appeared to be bright red blood.  No frank blood was noted in the bowel movement.  Dr. Elvera Lennox has been notified.  Orders received. Will continue to monitor.

## 2014-06-06 NOTE — H&P (Addendum)
[facility] History and Physical  Tony Johnson:[id number] DOB: 07-Apr-1946 DOA: 06/05/2014  Referring physician: ER physician. PCP: No primary provider on file.   Chief Complaint: Weakness and found.  HPI: Tony Johnson is a 68 y.o. male who was admitted last month for aphasia and was found to have acute pontine infarct was brought from the nursing home after patient had a fall and also patient was found to be increasingly weak. In the ER labs showed hypernatremia and acute renal failure and metabolic acidosis. As per the nursing home report patient has not been eating well last few days. Denies any chest pain or shortness of breath nausea vomiting fever chills diarrhea. He's been able to make urine. Patient has known history of previous stroke with left-sided hemiplegia and now also has aphasia from the recent stroke. Patient has been started on IV fluids and admitted for further management.  Review of Systems: As presented in the history of presenting illness, rest negative.  Past Medical History  Diagnosis Date  . Diabetes mellitus   . Hypertension   . CVA (cerebral infarction)     hx of   Past Surgical History  Procedure Laterality Date  . Ganglion cyst excision      s/p removal - wrist in 1965- 07/12/04   Social History:  reports that he has been smoking.  He does not have any smokeless tobacco history on file. He reports that he does not drink alcohol. His drug history is not on file. Where does patient live nursing home. Can patient participate in ADLs? Yes.  No Known Allergies  Family History:  Family History  Problem Relation Age of Onset  . Cancer Mother     breast cancer  . Diabetes Father       Prior to Admission medications   Medication Sig Start Date End Date Taking? Authorizing Provider  cloNIDine (CATAPRES) 0.1 MG tablet Take 0.1 mg by mouth every 12 (twelve) hours as needed (essential hypertension.).   Yes Historical Provider, MD   dipyridamole-aspirin (AGGRENOX) 200-25 MG per 12 hr capsule Take 1 capsule by mouth 2 (two) times daily. 05/11/14  Yes Maryann Mikhail, DO  ibuprofen (ADVIL,MOTRIN) 200 MG tablet Take 200 mg by mouth every 6 (six) hours as needed for headache or moderate pain.    Yes Historical Provider, MD  lisinopril (PRINIVIL,ZESTRIL) 10 MG tablet Take 10 mg by mouth daily.   Yes Historical Provider, MD  metFORMIN (GLUCOPHAGE) 1000 MG tablet Take 1,000 mg by mouth 2 (two) times daily with a meal.   Yes Historical Provider, MD  nystatin (MYCOSTATIN) 100000 UNIT/ML suspension Take 5 mLs by mouth 4 (four) times daily.   Yes Historical Provider, MD  polyethylene glycol (MIRALAX / GLYCOLAX) packet Take 17 g by mouth daily as needed (constipation.).   Yes Historical Provider, MD  pravastatin (PRAVACHOL) 40 MG tablet Take 40 mg by mouth every morning.    Yes Historical Provider, MD  senna (SENOKOT) 8.6 MG TABS tablet Take 1 tablet by mouth daily as needed for mild constipation.   Yes Historical Provider, MD    Physical Exam: Filed Vitals:   06/05/14 1938 06/05/14 1944 06/05/14 2351  BP:  107/62 106/50  Pulse:  101 113  Temp:  100.7 F (38.2 C) 98.1 F (36.7 C)  TempSrc:  Rectal Oral  Resp:  23 22  Height:   6' (1.829 m)  Weight:  81.647 kg (180 lb) 74.6 kg (164 lb 7.4 oz)  SpO2: 100% 99%  90%     General:  Moderately built and poorly nourished.  Eyes: Anicteric no pallor.  ENT: Some white spots on the tongue.  Neck: No mass felt. No neck rigidity.  Cardiovascular: S1-S2 heard.  Respiratory: No rhonchi or crepitations.  Abdomen: Soft nontender bowel sounds present. No guarding or rigidity.  Skin: No rash.  Musculoskeletal: No edema.  Psychiatric: Appears normal.  Neurologic: Alert and oriented to time place and person. Left-sided hemiplegia.  Labs on Admission:  Basic Metabolic Panel:  Recent Labs Lab 06/05/14 2036  NA 158*  K 5.0  CL 120*  CO2 17*  GLUCOSE 132*  BUN 113*   CREATININE 4.11*  CALCIUM 9.7   Liver Function Tests:  Recent Labs Lab 06/05/14 2036  AST 14  ALT 10  ALKPHOS 89  BILITOT 0.4  PROT 7.9  ALBUMIN 4.1   No results found for this basename: LIPASE, AMYLASE,  in the last 168 hours No results found for this basename: AMMONIA,  in the last 168 hours CBC:  Recent Labs Lab 06/05/14 2036  WBC 17.3*  NEUTROABS 13.5*  HGB 15.5  HCT 45.9  MCV 97.2  PLT 308   Cardiac Enzymes: No results found for this basename: CKTOTAL, CKMB, CKMBINDEX, TROPONINI,  in the last 168 hours  BNP (last 3 results) No results found for this basename: PROBNP,  in the last 8760 hours CBG:  Recent Labs Lab 06/05/14 1949  GLUCAP 130*    Radiological Exams on Admission: Dg Chest 2 View  06/05/2014   CLINICAL DATA:  Generalized weakness 2 days.  Recent fall.  EXAM: CHEST  2 VIEW  COMPARISON:  05/08/2014  FINDINGS: Lungs are adequately inflated and otherwise clear. The cardiomediastinal silhouette is within normal. There is dislocation of the left humeral head.  IMPRESSION: No acute cardiopulmonary disease.  Dislocation of the left humeral head. Recommend left shoulder series for better evaluation.   Electronically Signed   By: Elberta Fortis M.D.   On: 06/05/2014 20:50   Ct Head Wo Contrast  06/05/2014   CLINICAL DATA:  Generalized weakness for 2 days. Fall today. Dysphasia.  EXAM: CT HEAD WITHOUT CONTRAST  CT CERVICAL SPINE WITHOUT CONTRAST  TECHNIQUE: Multidetector CT imaging of the head and cervical spine was performed following the standard protocol without intravenous contrast. Multiplanar CT image reconstructions of the cervical spine were also generated.  COMPARISON:  MRI brain 05/08/2014.  CT head 05/07/2014.  FINDINGS: CT HEAD FINDINGS  Diffuse cerebral atrophy. Mild ventricular dilatation consistent with central atrophy. Low-attenuation changes in the deep white matter consistent with small vessel ischemia. Focal areas of encephalomalacia consistent  with old infarcts in the right posterior frontal and anterior parietal region and in the left anterior inferior frontal region. Focal low-attenuation change in the pons correlating with prior infarct. No mass effect or midline shift. No abnormal extra-axial fluid collections. Gray-white matter junctions are distinct. Basal cisterns are not effaced. No evidence of acute intracranial hemorrhage. Vascular calcifications. Old nasal bone fracture/deformity. No depressed skull fractures. Paranasal sinuses are clear. No significant change since prior study.  CT CERVICAL SPINE FINDINGS  Straightening of the usual cervical lordosis which may be due to patient positioning but ligamentous injury or muscle spasm could also have this appearance. Degenerative changes throughout the cervical spine. Narrowed cervical interspaces at C5-6 and C6-7 levels with associated endplate hypertrophic changes and moderately prominent disc osteophyte complexes posteriorly. Degenerative changes throughout the facet joints. 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 C1-2 articulation appears intact. No  focal bone lesion or bone destruction. Bone cortex and trabecular architecture appears intact. No prevertebral soft tissue swelling. Diffuse homogeneous enlargement of the thyroid gland.  IMPRESSION: No acute intracranial abnormalities. Multiple old infarcts and chronic atrophy/ small vessel ischemic changes.  Degenerative changes in the cervical spine. Nonspecific straightening of the usual cervical lordosis. No displaced fractures identified.   Electronically Signed   By: Burman Nieves M.D.   On: 06/05/2014 22:19   Ct Cervical Spine Wo Contrast  06/05/2014   CLINICAL DATA:  Generalized weakness for 2 days. Fall today. Dysphasia.  EXAM: CT HEAD WITHOUT CONTRAST  CT CERVICAL SPINE WITHOUT CONTRAST  TECHNIQUE: Multidetector CT imaging of the head and cervical spine was performed following the standard protocol without intravenous contrast.  Multiplanar CT image reconstructions of the cervical spine were also generated.  COMPARISON:  MRI brain 05/08/2014.  CT head 05/07/2014.  FINDINGS: CT HEAD FINDINGS  Diffuse cerebral atrophy. Mild ventricular dilatation consistent with central atrophy. Low-attenuation changes in the deep white matter consistent with small vessel ischemia. Focal areas of encephalomalacia consistent with old infarcts in the right posterior frontal and anterior parietal region and in the left anterior inferior frontal region. Focal low-attenuation change in the pons correlating with prior infarct. No mass effect or midline shift. No abnormal extra-axial fluid collections. Gray-white matter junctions are distinct. Basal cisterns are not effaced. No evidence of acute intracranial hemorrhage. Vascular calcifications. Old nasal bone fracture/deformity. No depressed skull fractures. Paranasal sinuses are clear. No significant change since prior study.  CT CERVICAL SPINE FINDINGS  Straightening of the usual cervical lordosis which may be due to patient positioning but ligamentous injury or muscle spasm could also have this appearance. Degenerative changes throughout the cervical spine. Narrowed cervical interspaces at C5-6 and C6-7 levels with associated endplate hypertrophic changes and moderately prominent disc osteophyte complexes posteriorly. Degenerative changes throughout the facet joints. 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 C1-2 articulation appears intact. No focal bone lesion or bone destruction. Bone cortex and trabecular architecture appears intact. No prevertebral soft tissue swelling. Diffuse homogeneous enlargement of the thyroid gland.  IMPRESSION: No acute intracranial abnormalities. Multiple old infarcts and chronic atrophy/ small vessel ischemic changes.  Degenerative changes in the cervical spine. Nonspecific straightening of the usual cervical lordosis. No displaced fractures identified.   Electronically Signed    By: Burman Nieves M.D.   On: 06/05/2014 22:19   Dg Shoulder Left  06/05/2014   CLINICAL DATA:  Left shoulder weakness.  EXAM: LEFT SHOULDER - 2+ VIEW  COMPARISON:  Chest 06/05/2014  FINDINGS: Left shoulder appears normally located. No evidence of acute fracture or dislocation currently. Subacromial space, acromioclavicular space, and coracoclavicular spaces are intact and normal. No focal bone lesion or bone destruction. Soft tissues appear unremarkable.  IMPRESSION: No acute bony abnormalities demonstrated.   Electronically Signed   By: Burman Nieves M.D.   On: 06/05/2014 22:08     Assessment/Plan Active Problems:   HYPERTENSION   DIABETES MELLITUS, TYPE II   Acute renal failure   Hypernatremia   Metabolic acidosis   1. Acute renal failure with hypernatremia - suspect poor oral intake with patient being on lisinopril NSAIDs further worsening patient's renal function. At this time I have placed patient on D5W since patient has significant hypernatremia. Patient is diabetic closely follow CBGs since patient is on D5W. Hold lisinopril and discontinue NSAIDs. Check urine studies including urine creatinine and sodium to check FeNa. Check CT abdomen and pelvis to rule out any hydronephrosis  or any intra-abdominal causes. Check SPEP. 2. Metabolic acidosis - probably secondary to #1. Closely follow metabolic panel. If with hydration metabolic acidosis does not improve may need bicarbonate drip. 3. Weakness probably secondary to dehydration - hydrate and closely follow intake output. Check CK levels and troponin and TSH. 4. Leucocytosis - was initially febrile but now temperature back to baseline. No definite signs of infection. Follow blood cultures. 5. Hypertension - discontinue lisinopril secondary to #1. Patient is on clonidine. 6. Diabetes mellitus - discontinue metformin due to #1. On sliding-scale. See #1 for IV fluids and CBG checks. 7. Recent stroke with aphasia and left-sided hemiplegia  - on Aggrenox.    Code Status: Full code.  Family Communication: None.  Disposition Plan: Admit to inpatient.    Khyri Hinzman N. [facility] Pager [phone].  If 7PM-7AM, please contact night-coverage [email or web address] Password [facility] 06/06/2014, 12:32 AM

## 2014-06-06 NOTE — Progress Notes (Signed)
Patient dont have any urine output since last night. Pt had of P.O. and IV runs at 167ml/hr. showed on bladderscan. M.D. Informed. No new orders. Will continue to monitor

## 2014-06-06 NOTE — Evaluation (Addendum)
Clinical/Bedside Swallow Evaluation Patient Details  Name: Tony Johnson MRN: [id number] Date of Birth: April 18, 1946  Today's Date: 06/06/2014 Time: 4098-1191 SLP Time Calculation (min): 33 min  Past Medical History:  Past Medical History  Diagnosis Date  . Diabetes mellitus   . Hypertension   . CVA (cerebral infarction)     hx of   Past Surgical History:  Past Surgical History  Procedure Laterality Date  . Ganglion cyst excision      s/p removal - wrist in 1965- 07/12/04   HPI:  68 yo male adm to [facility] after near fall from wheelchair and weakness, decreased intake x a few days.  Pt PMH + for CVA with left HP, dysphagia, dysarthria.  BSE ordered due to pt coughing with intake.     Assessment / Plan / Recommendation Clinical Impression  Pt's swallow ability appears consistent with findings during BSE when pt hospitalized 04/2014.  Oral delays in transiting noted up to 20 seconds with liquids and for 5 minutes with solids.  Delays in swallow triggering were inconsisent.  Excessive "mastication" noted with oral residual of cracker and pt decreased attention to task (Pt watching C-span and did not want it turned down).    Overt coughing noted with thin water via tsp, cup and SLP with 4 of approximately 7 trials.  SLP suspect pt has been having ongoing aspiration since his last CVA.  Pt tolerated nectar thick liquids well but declined to consume them and repeatedly stated "water".    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  Requested pt to pay attention to tolerance and see if consuming Ensure drinks, etc decreases overt coughing  as this may increase comfort.  Advised thin water recommended if he insists to consume thin drinks.    Option of using Ensure with meals to decrease overt aspiration and consumption of thin water between meals discussed and pt declined at this time.  Pt agreeable to  SLP follow up.      Aspiration Risk  Moderate    Diet Recommendation Regular;Thin liquid;Nectar-thick liquid (defer to MD, pt desires thin liquids even with increased risk)   Liquid Administration via: Cup;Straw Medication Administration:  (as tolerated) Supervision: Patient able to self feed Compensations: Slow rate;Small sips/bites;Check for pocketing Postural Changes and/or Swallow Maneuvers: Seated upright 90 degrees;Upright 30-60 min after meal    Other  Recommendations Oral Care Recommendations: Oral care BID   Follow Up Recommendations    TBD   Frequency and Duration min 1 x/week  1 week   Pertinent Vitals/Pain Low grade temperature     Swallow Study Prior Functional Status   see hhx    General Date of Onset: 06/06/14 HPI: 68 yo male adm to [facility] after near fall from wheelchair and weakness, decreased intake x a few days.  Pt PMH + for CVA with left HP, dysphagia, dysarthria.  BSE ordered due to pt coughing with intake.   Type of Study: Bedside swallow evaluation Diet Prior to this Study: Regular;Thin liquids Temperature Spikes Noted: No Respiratory Status: Room air History of Recent Intubation: No Behavior/Cognition: Alert;Cooperative;Pleasant mood Oral Cavity - Dentition:  (two lower teeth, no upper dentition) Self-Feeding Abilities: Able to feed self Patient Positioning: Upright in bed Baseline Vocal Quality: Clear Volitional Cough: Weak Volitional Swallow: Unable to elicit (likely due to motor planning issues from previous cvas)    Oral/Motor/Sensory Function Labial ROM:  Reduced left Labial Strength: Reduced Lingual ROM: Reduced left Lingual Symmetry: Abnormal symmetry left Lingual Strength: Reduced Facial ROM: Reduced left Facial Symmetry: Left droop Velum: Within Functional Limits   Ice Chips Ice chips: Impaired Oral Phase Impairments: Reduced lingual movement/coordination;Impaired anterior to posterior transit Pharyngeal Phase Impairments: Suspected  delayed Swallow;Cough - Delayed   Thin Liquid Thin Liquid: Impaired Presentation: Cup;Self Fed;Spoon;Straw Oral Phase Impairments: Reduced lingual movement/coordination;Impaired anterior to posterior transit Oral Phase Functional Implications: Left anterior spillage;Prolonged oral transit Pharyngeal  Phase Impairments: Suspected delayed Swallow;Cough - Immediate Other Comments: coughing episodes x4 of approximately 7 trials noted after pt swallowed thin liquids, use of tsp, cup or straw did not decrease aspiration symptoms    Nectar Thick Nectar Thick Liquid: Impaired Oral Phase Impairments: Reduced lingual movement/coordination;Impaired anterior to posterior transit Pharyngeal Phase Impairments: Suspected delayed Swallow   Honey Thick Honey Thick Liquid: Not tested   Puree Puree: Impaired Presentation: Spoon;Self Fed Oral Phase Impairments: Impaired anterior to posterior transit;Reduced lingual movement/coordination Oral Phase Functional Implications: Prolonged oral transit Pharyngeal Phase Impairments: Suspected delayed Swallow   Solid   GO    Solid: Impaired Presentation: Self Fed Oral Phase Impairments: Reduced lingual movement/coordination;Impaired anterior to posterior transit;Other (comment);Impaired mastication (excessive "mastication" for greater than 5 minutes, pt watching tv and resistant to slp cues to turn off and focus on intake) Oral Phase Functional Implications: Oral residue;Left lateral sulci pocketing Pharyngeal Phase Impairments: Suspected delayed Fredric Mare, MS [facility] SLP [phone]

## 2014-06-06 NOTE — Progress Notes (Signed)
Patient seen and examined this morning, agree with H&P.   Hypernatremic likely due to dehydration, improving with D5, decrease rate to prevent overcorrection, Na stabilized 142>141.  Acute renal failure - improving with fluids. Acidosis improving as well. Urine output improving. Recent CVA - no new neurologic findings, SLP evaluated patient today, regular diet Diarrhea - check C diff DM - SSI Weakness - PT consult.   Costin M. Elvera Lennox, MD [facility] [phone]

## 2014-06-07 LAB — CBC
HCT: 33.2 % — ABNORMAL LOW (ref 39.0–52.0)
Hemoglobin: 11.7 g/dL — ABNORMAL LOW (ref 13.0–17.0)
MCH: 32.4 pg (ref 26.0–34.0)
MCHC: 34.6 g/dL (ref 30.0–36.0)
MCV: 93.5 fL (ref 78.0–100.0)
PLATELETS: 219 10*3/uL (ref 150–400)
RBC: 3.55 MIL/uL — AB (ref 4.22–5.81)
RDW: 15.4 % (ref 11.5–15.5)
WBC: 8.9 10*3/uL (ref 4.0–10.5)

## 2014-06-07 LAB — BASIC METABOLIC PANEL
ANION GAP: 15 (ref 5–15)
BUN: 83 mg/dL — ABNORMAL HIGH (ref 6–23)
CALCIUM: 8.2 mg/dL — AB (ref 8.4–10.5)
CO2: 17 mEq/L — ABNORMAL LOW (ref 19–32)
Chloride: 107 mEq/L (ref 96–112)
Creatinine, Ser: 1.69 mg/dL — ABNORMAL HIGH (ref 0.50–1.35)
GFR calc Af Amer: 47 mL/min — ABNORMAL LOW (ref >90)
GFR, EST NON AFRICAN AMERICAN: 40 mL/min — AB (ref >90)
GLUCOSE: 119 mg/dL — AB (ref 70–99)
POTASSIUM: 4.2 meq/L (ref 3.7–5.3)
SODIUM: 139 meq/L (ref 137–147)

## 2014-06-07 LAB — GLUCOSE, CAPILLARY
GLUCOSE-CAPILLARY: 155 mg/dL — AB (ref 70–99)
Glucose-Capillary: 111 mg/dL — ABNORMAL HIGH (ref 70–99)
Glucose-Capillary: 102 mg/dL — ABNORMAL HIGH (ref 70–99)

## 2014-06-07 LAB — CLOSTRIDIUM DIFFICILE BY PCR: Toxigenic C. Difficile by PCR: NEGATIVE

## 2014-06-07 NOTE — Progress Notes (Signed)
PROGRESS NOTE  Tony Johnson:[id number] DOB: 09-02-1946 DOA: 06/05/2014 PCP: No primary provider on file.   Subjective/ 24 H Interval events - no events, feeling better this morning, eating breakfast   Assessment/Plan: Hypernatremia likely due to dehydration, - improved and now stable, good po intake,  Acute renal failure - improving with fluids. Acidosis improving as well. Urine output improving.  - continue IVF for now Recent CVA - no new neurologic findings, SLP evaluated patient 8/26, regular diet  Diarrhea - 1 episode, C diff negative DM - SSI  Weakness - PT consult.    Diet: regular  Fluids: NS 75 cc/h DVT Prophylaxis: SCD  Code Status: Full Family Communication: d/w patient  Disposition Plan: SNF when ready   Consultants:  None   Procedures:  None    Antibiotics None   Studies  Filed Vitals:   06/06/14 0422 06/06/14 1330 06/06/14 2251 06/07/14 0539  BP: 102/59 99/60 105/62 113/53  Pulse: 76 71 64 65  Temp: 98 F (36.7 C) 98.2 F (36.8 C) 97.4 F (36.3 C) 98.3 F (36.8 C)  TempSrc: Oral Oral Oral Oral  Resp: Height:      Weight:    79 kg (174 lb 2.6 oz)  SpO2: 99% 100% 100% 100%    Intake/Output Summary (Last 24 hours) at 06/07/14 1209 Last data filed at 06/07/14 0541  Gross per 24 hour  Intake 946.25 ml  Output    871 ml  Net  75.25 ml   Filed Weights   06/05/14 1944 06/05/14 2351 06/07/14 0539  Weight: 81.647 kg (180 lb) 74.6 kg (164 lb 7.4 oz) 79 kg (174 lb 2.6 oz)    Exam:  General:  NAD  Cardiovascular: RRR  Respiratory: clear, no wheezing  Neuro: left sided hemiplegia  Data Reviewed: Basic Metabolic Panel:  Recent Labs Lab 06/06/14 0440 06/06/14 0840 06/06/14 1229 06/06/14 2108 06/07/14 0441  NA 150* 142 141 138 139  K 4.6 3.7 3.6* 3.4* 4.2  CL 113* 108 107 105 107  CO2 15* 15* 18* 18* 17*  GLUCOSE 176* 159* 156* 126* 119*  BUN 113* 109* 104* 92* 83*  CREATININE 4.05* 3.50* 3.07* 2.15*  1.69*  CALCIUM 8.9 8.7 8.5 8.5 8.2*   Liver Function Tests:  Recent Labs Lab 06/05/14 2036 06/06/14 0440  AST 14 20  ALT 10 11  ALKPHOS 89 78  BILITOT 0.4 0.3  PROT 7.9 7.0  ALBUMIN 4.1 3.6   CBC:  Recent Labs Lab 06/05/14 2036 06/06/14 0440 06/07/14 0441  WBC 17.3* 15.9* 8.9  NEUTROABS 13.5* 12.3*  --   HGB 15.5 13.9 11.7*  HCT 45.9 42.0 33.2*  MCV 97.2 97.4 93.5  PLT 308 269 219   Cardiac Enzymes:  Recent Labs Lab 06/06/14 0440  CKTOTAL 488*  TROPONINI <0.30   CBG:  Recent Labs Lab 06/06/14 0732 06/06/14 1206 06/06/14 1714 06/06/14 2249 06/07/14 0733  GLUCAP 164* 147* 148* 117* 111*    Recent Results (from the past 240 hour(s))  CULTURE, BLOOD (ROUTINE X 2)     Status: None   Collection Time    06/05/14  8:36 PM      Result Value Ref Range Status   Specimen Description BLOOD RIGHT FOREARM   Final   Special Requests BOTTLES DRAWN AEROBIC AND ANAEROBIC 4CC   Final   Culture  Setup Time     Final   Value: 06/06/2014 01:26     Performed at [facility]   Culture     Final   Value:        BLOOD CULTURE RECEIVED NO GROWTH TO DATE CULTURE WILL BE HELD FOR 5 DAYS BEFORE ISSUING A FINAL NEGATIVE REPORT     Performed at [facility]   Report Status PENDING   Incomplete  CULTURE, BLOOD (ROUTINE X 2)     Status: None   Collection Time    06/05/14 10:18 PM      Result Value Ref Range Status   Specimen Description BLOOD LEFT ANTECUBITAL   Final   Special Requests BOTTLES DRAWN AEROBIC AND ANAEROBIC 5CC   Final   Culture  Setup Time     Final   Value: 06/06/2014 03:23     Performed at [facility]   Culture     Final   Value:        BLOOD CULTURE RECEIVED NO GROWTH TO DATE CULTURE WILL BE HELD FOR 5 DAYS BEFORE ISSUING A FINAL NEGATIVE REPORT     Performed at [facility]   Report Status PENDING   Incomplete  CULTURE, BLOOD (ROUTINE X 2)     Status: None   Collection Time    06/05/14 10:23 PM      Result Value Ref  Range Status   Specimen Description BLOOD RIGHT HAND   Final   Special Requests BOTTLES DRAWN AEROBIC AND ANAEROBIC 5CC   Final   Culture  Setup Time     Final   Value: 06/06/2014 01:25     Performed at [facility]   Culture     Final   Value:        BLOOD CULTURE RECEIVED NO GROWTH TO DATE CULTURE WILL BE HELD FOR 5 DAYS BEFORE ISSUING A FINAL NEGATIVE REPORT     Performed at [facility]   Report Status PENDING   Incomplete  MRSA PCR SCREENING     Status: None   Collection Time    06/06/14 12:30 AM      Result Value Ref Range Status   MRSA by PCR NEGATIVE  NEGATIVE Final   Comment:            The GeneXpert MRSA Assay (FDA     approved for NASAL specimens     only), is one component of a     comprehensive MRSA colonization     surveillance program. It is not     intended to diagnose MRSA     infection nor to guide or     monitor treatment for     MRSA infections.  CLOSTRIDIUM DIFFICILE BY PCR     Status: None   Collection Time    06/06/14  4:27 PM      Result Value Ref Range Status   C difficile by pcr NEGATIVE  NEGATIVE Final   Comment: Performed at [facility]     Studies: Ct Abdomen Pelvis Wo Contrast  06/06/2014   CLINICAL DATA:  Acute renal failure  EXAM: CT ABDOMEN AND PELVIS WITHOUT CONTRAST  TECHNIQUE: Multidetector CT imaging of the abdomen and pelvis was performed following the standard protocol without IV contrast.  COMPARISON:  None.  FINDINGS: Lung bases are well aerated with minimal scarring in the left medial lung base.  The liver, gallbladder, spleen, adrenal glands and pancreas are within normal limits. The kidneys are well visualized bilaterally. Few tiny nonobstructing renal stones are noted on the left. No hydronephrosis is identified.  No obstructive changes are seen. The bladder is partially distended with urine.  The appendix is within normal limits. A fat containing periumbilical hernia is noted. Diffuse aortic calcifications are  noted without aneurysmal dilatation. No significant lymphadenopathy is seen. Degenerative changes of the lumbar spine and sacroiliac joints are seen.  IMPRESSION: No evidence of hydronephrosis noted. Tiny nonobstructing left renal stones are noted.  Fat containing periumbilical hernia.   Electronically Signed   By: Alcide Clever M.D.   On: 06/06/2014 08:33    Scheduled Meds: . dipyridamole-aspirin  1 capsule Oral BID  . insulin aspart  0-9 Units Subcutaneous TID WC  . simvastatin  20 mg Oral q1800  . sodium chloride  3 mL Intravenous Q12H   Continuous Infusions: . sodium chloride 75 mL/hr at 06/07/14 6045    Active Problems:   HYPERTENSION   DIABETES MELLITUS, TYPE II   Acute renal failure   Hypernatremia   Metabolic acidosis  Time spent: 25  This note has been created with Education officer, environmental. Any transcriptional errors are unintentional.   Pamella Pert, MD [facility] Pager [phone]. If 7 PM - 7 AM, please contact night-coverage at [email or web address], password [facility] 06/07/2014, 12:09 PM  LOS: 2 days

## 2014-06-07 NOTE — Progress Notes (Signed)
Clinical Social Work Department BRIEF PSYCHOSOCIAL ASSESSMENT 06/07/2014  Patient:  Tony Johnson, Tony Johnson     Account Number:  [id number]     Admit date:  06/05/2014  Clinical Social Worker:  Tony Johnson  Date/Time:  06/07/2014 02:20 PM  Referred by:  Physician  Date Referred:  06/07/2014 Referred for  Other - See comment   Other Referral:   Admitted from: [facility]   Interview type:  Patient Other interview type:   left voicemail for patient's daughter, Tony Johnson    PSYCHOSOCIAL DATA Living Status:  FACILITY Admitted from facility:  [facility] Level of care:  Skilled Nursing Facility Primary support name:  Tony Johnson (daughter) ph#: [phone] Primary support relationship to patient:  CHILD, ADULT Degree of support available:   good    CURRENT CONCERNS Current Concerns  Post-Acute Placement   Other Concerns:    SOCIAL WORK ASSESSMENT / PLAN CSW received consult that patient was admitted from [facility].   Assessment/plan status:  Information/Referral to [facility] Other assessment/ plan:   Information/referral to community resources:   CSW completed FL2 and faxed information to [facility], confirmed with Sima Matas @ SNF that they would be able to take patient back at discharge.    PATIENT'S/FAMILY'S RESPONSE TO PLAN OF CARE: Patient states that he'd like to return to [facility] when discharged. CSW left message for patient's daughter, Tony Johnson to confirm.       Tony Maxin, LCSW [facility] Clinical Social Worker cell #: [phone]

## 2014-06-08 DIAGNOSIS — R197 Diarrhea, unspecified: Secondary | ICD-10-CM | POA: Diagnosis present

## 2014-06-08 LAB — BASIC METABOLIC PANEL
ANION GAP: 11 (ref 5–15)
BUN: 37 mg/dL — ABNORMAL HIGH (ref 6–23)
CHLORIDE: 111 meq/L (ref 96–112)
CO2: 19 mEq/L (ref 19–32)
Calcium: 8.2 mg/dL — ABNORMAL LOW (ref 8.4–10.5)
Creatinine, Ser: 0.93 mg/dL (ref 0.50–1.35)
GFR calc non Af Amer: 84 mL/min — ABNORMAL LOW (ref >90)
Glucose, Bld: 105 mg/dL — ABNORMAL HIGH (ref 70–99)
POTASSIUM: 3.5 meq/L — AB (ref 3.7–5.3)
Sodium: 141 mEq/L (ref 137–147)

## 2014-06-08 LAB — PROTEIN ELECTROPHORESIS, SERUM
ALPHA-1-GLOBULIN: 5 % — AB (ref 2.9–4.9)
ALPHA-2-GLOBULIN: 14 % — AB (ref 7.1–11.8)
Albumin ELP: 56 % (ref 55.8–66.1)
BETA GLOBULIN: 5.7 % (ref 4.7–7.2)
Beta 2: 6.2 % (ref 3.2–6.5)
Gamma Globulin: 13.1 % (ref 11.1–18.8)
M-SPIKE, %: NOT DETECTED g/dL
Total Protein ELP: 6.3 g/dL (ref 6.0–8.3)

## 2014-06-08 LAB — GLUCOSE, CAPILLARY
GLUCOSE-CAPILLARY: 111 mg/dL — AB (ref 70–99)
GLUCOSE-CAPILLARY: 87 mg/dL (ref 70–99)
GLUCOSE-CAPILLARY: 142 mg/dL — AB (ref 70–99)

## 2014-06-08 NOTE — Discharge Summary (Signed)
Physician Discharge Summary  Tony Johnson ZOX:[id number] DOB: 06/04/1946 DOA: 06/05/2014  PCP: No primary provider on file.  Admit date: 06/05/2014 Discharge date: 06/08/2014  Time spent: 35 minutes  Recommendations for Outpatient Follow-up:  1. Follow up with Primary MD at SNF in 1 week 2. Follow up with Neurology as an outpatient    Recommendations for primary care physician for things to follow:  Repeat BMP in 3-4 days  Discharge Diagnoses:  Active Problems:   HYPERTENSION   DIABETES MELLITUS, TYPE II   Acute renal failure   Hypernatremia   Metabolic acidosis   Diarrhea  Discharge Condition: stable  Diet recommendation: regular  Filed Weights   06/05/14 2351 06/07/14 0539 06/08/14 0419  Weight: 74.6 kg (164 lb 7.4 oz) 79 kg (174 lb 2.6 oz) 78.8 kg (173 lb 11.6 oz)   History of present illness:  Tony Johnson is a 68 y.o. male who was admitted last month for aphasia and was found to have acute pontine infarct was brought from the nursing home after patient had a fall and also patient was found to be increasingly weak. In the ER labs showed hypernatremia and acute renal failure and metabolic acidosis. As per the nursing home report patient has not been eating well last few days. Denies any chest pain or shortness of breath nausea vomiting fever chills diarrhea. He's been able to make urine. Patient has known history of previous stroke with left-sided hemiplegia and now also has aphasia from the recent stroke. Patient has been started on IV fluids and admitted for further management.  Hospital Course:  Patient was admitted to the hospital with hypernatremia and acute renal failure in the setting of dehydration which was thought to be multifactorial due to poor po intake and diarrhea. He was started on intravenous fluids initially as dextrose 5 then transitioned to normal saline with improvement in in sodium levels, which are now stable for 48 hours and patient has adequate  po intake. His renal function improved significantly with hydration and Creatinine on discharge is 0.93. i recommend that his po intake is monitored closely and he has a repeat BMP early next week. With his history of CVA no new neurologic deficits were appreciated. SLP was consulted and allowed patient a regular diet which he seems to be tolerating well. I will hold his Lisinopril and his Advil on discharge given renal failure and based on future labs these may be considered to be restarted. His diarrhea improved, C diff was checked and it was negative, FOBT was also checked and it was negative. He will be discharged to rehab in stable condition to resume his post CVA rehabilitation.  Procedures:  None    Consultations:  None   Discharge Exam: Filed Vitals:   06/07/14 0539 06/07/14 1400 06/07/14 2049 06/08/14 0419  BP: 113/53 104/44 118/61 112/59  Pulse: 65 60 66 62  Temp: 98.3 F (36.8 C) 97.5 F (36.4 C) 98.1 F (36.7 C) 98 F (36.7 C)  TempSrc: Oral Oral Oral Oral  Resp: Height:      Weight: 79 kg (174 lb 2.6 oz)   78.8 kg (173 lb 11.6 oz)  SpO2: 100% 100% 100% 100%   General: NAD Cardiovascular: RRR Respiratory: CTA biL  Discharge Instructions    Medication List    STOP taking these medications       ibuprofen 200 MG tablet  Commonly known as:  ADVIL,MOTRIN     lisinopril 10  MG tablet  Commonly known as:  PRINIVIL,ZESTRIL      TAKE these medications       cloNIDine 0.1 MG tablet  Commonly known as:  CATAPRES  Take 0.1 mg by mouth every 12 (twelve) hours as needed (essential hypertension.).     dipyridamole-aspirin 200-25 MG per 12 hr capsule  Commonly known as:  AGGRENOX  Take 1 capsule by mouth 2 (two) times daily.     metFORMIN 1000 MG tablet  Commonly known as:  GLUCOPHAGE  Take 1,000 mg by mouth 2 (two) times daily with a meal.     nystatin 100000 UNIT/ML suspension  Commonly known as:  MYCOSTATIN  Take 5 mLs by mouth 4 (four) times  daily.     polyethylene glycol packet  Commonly known as:  MIRALAX / GLYCOLAX  Take 17 g by mouth daily as needed (constipation.).     pravastatin 40 MG tablet  Commonly known as:  PRAVACHOL  Take 40 mg by mouth every morning.     senna 8.6 MG Tabs tablet  Commonly known as:  SENOKOT  Take 1 tablet by mouth daily as needed for mild constipation.         The results of significant diagnostics from this hospitalization (including imaging, microbiology, ancillary and laboratory) are listed below for reference.    Significant Diagnostic Studies: Ct Abdomen Pelvis Wo Contrast  06/06/2014   CLINICAL DATA:  Acute renal failure  EXAM: CT ABDOMEN AND PELVIS WITHOUT CONTRAST  TECHNIQUE: Multidetector CT imaging of the abdomen and pelvis was performed following the standard protocol without IV contrast.  COMPARISON:  None.  FINDINGS: Lung bases are well aerated with minimal scarring in the left medial lung base.  The liver, gallbladder, spleen, adrenal glands and pancreas are within normal limits. The kidneys are well visualized bilaterally. Few tiny nonobstructing renal stones are noted on the left. No hydronephrosis is identified. No obstructive changes are seen. The bladder is partially distended with urine.  The appendix is within normal limits. A fat containing periumbilical hernia is noted. Diffuse aortic calcifications are noted without aneurysmal dilatation. No significant lymphadenopathy is seen. Degenerative changes of the lumbar spine and sacroiliac joints are seen.  IMPRESSION: No evidence of hydronephrosis noted. Tiny nonobstructing left renal stones are noted.  Fat containing periumbilical hernia.   Electronically Signed   By: Alcide Clever M.D.   On: 06/06/2014 08:33   Dg Chest 2 View  06/05/2014   CLINICAL DATA:  Generalized weakness 2 days.  Recent fall.  EXAM: CHEST  2 VIEW  COMPARISON:  05/08/2014  FINDINGS: Lungs are adequately inflated and otherwise clear. The cardiomediastinal  silhouette is within normal. There is dislocation of the left humeral head.  IMPRESSION: No acute cardiopulmonary disease.  Dislocation of the left humeral head. Recommend left shoulder series for better evaluation.   Electronically Signed   By: Elberta Fortis M.D.   On: 06/05/2014 20:50   Ct Head Wo Contrast  06/05/2014   CLINICAL DATA:  Generalized weakness for 2 days. Fall today. Dysphasia.  EXAM: CT HEAD WITHOUT CONTRAST  CT CERVICAL SPINE WITHOUT CONTRAST  TECHNIQUE: Multidetector CT imaging of the head and cervical spine was performed following the standard protocol without intravenous contrast. Multiplanar CT image reconstructions of the cervical spine were also generated.  COMPARISON:  MRI brain 05/08/2014.  CT head 05/07/2014.  FINDINGS: CT HEAD FINDINGS  Diffuse cerebral atrophy. Mild ventricular dilatation consistent with central atrophy. Low-attenuation changes in the deep white matter consistent  with small vessel ischemia. Focal areas of encephalomalacia consistent with old infarcts in the right posterior frontal and anterior parietal region and in the left anterior inferior frontal region. Focal low-attenuation change in the pons correlating with prior infarct. No mass effect or midline shift. No abnormal extra-axial fluid collections. Gray-white matter junctions are distinct. Basal cisterns are not effaced. No evidence of acute intracranial hemorrhage. Vascular calcifications. Old nasal bone fracture/deformity. No depressed skull fractures. Paranasal sinuses are clear. No significant change since prior study.  CT CERVICAL SPINE FINDINGS  Straightening of the usual cervical lordosis which may be due to patient positioning but ligamentous injury or muscle spasm could also have this appearance. Degenerative changes throughout the cervical spine. Narrowed cervical interspaces at C5-6 and C6-7 levels with associated endplate hypertrophic changes and moderately prominent disc osteophyte complexes  posteriorly. Degenerative changes throughout the facet joints. 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 C1-2 articulation appears intact. No focal bone lesion or bone destruction. Bone cortex and trabecular architecture appears intact. No prevertebral soft tissue swelling. Diffuse homogeneous enlargement of the thyroid gland.  IMPRESSION: No acute intracranial abnormalities. Multiple old infarcts and chronic atrophy/ small vessel ischemic changes.  Degenerative changes in the cervical spine. Nonspecific straightening of the usual cervical lordosis. No displaced fractures identified.   Electronically Signed   By: Burman Nieves M.D.   On: 06/05/2014 22:19   Ct Cervical Spine Wo Contrast  06/05/2014   CLINICAL DATA:  Generalized weakness for 2 days. Fall today. Dysphasia.  EXAM: CT HEAD WITHOUT CONTRAST  CT CERVICAL SPINE WITHOUT CONTRAST  TECHNIQUE: Multidetector CT imaging of the head and cervical spine was performed following the standard protocol without intravenous contrast. Multiplanar CT image reconstructions of the cervical spine were also generated.  COMPARISON:  MRI brain 05/08/2014.  CT head 05/07/2014.  FINDINGS: CT HEAD FINDINGS  Diffuse cerebral atrophy. Mild ventricular dilatation consistent with central atrophy. Low-attenuation changes in the deep white matter consistent with small vessel ischemia. Focal areas of encephalomalacia consistent with old infarcts in the right posterior frontal and anterior parietal region and in the left anterior inferior frontal region. Focal low-attenuation change in the pons correlating with prior infarct. No mass effect or midline shift. No abnormal extra-axial fluid collections. Gray-white matter junctions are distinct. Basal cisterns are not effaced. No evidence of acute intracranial hemorrhage. Vascular calcifications. Old nasal bone fracture/deformity. No depressed skull fractures. Paranasal sinuses are clear. No significant change since prior study.  CT  CERVICAL SPINE FINDINGS  Straightening of the usual cervical lordosis which may be due to patient positioning but ligamentous injury or muscle spasm could also have this appearance. Degenerative changes throughout the cervical spine. Narrowed cervical interspaces at C5-6 and C6-7 levels with associated endplate hypertrophic changes and moderately prominent disc osteophyte complexes posteriorly. Degenerative changes throughout the facet joints. 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 C1-2 articulation appears intact. No focal bone lesion or bone destruction. Bone cortex and trabecular architecture appears intact. No prevertebral soft tissue swelling. Diffuse homogeneous enlargement of the thyroid gland.  IMPRESSION: No acute intracranial abnormalities. Multiple old infarcts and chronic atrophy/ small vessel ischemic changes.  Degenerative changes in the cervical spine. Nonspecific straightening of the usual cervical lordosis. No displaced fractures identified.   Electronically Signed   By: Burman Nieves M.D.   On: 06/05/2014 22:19   Dg Shoulder Left  06/05/2014   CLINICAL DATA:  Left shoulder weakness.  EXAM: LEFT SHOULDER - 2+ VIEW  COMPARISON:  Chest 06/05/2014  FINDINGS: Left shoulder  appears normally located. No evidence of acute fracture or dislocation currently. Subacromial space, acromioclavicular space, and coracoclavicular spaces are intact and normal. No focal bone lesion or bone destruction. Soft tissues appear unremarkable.  IMPRESSION: No acute bony abnormalities demonstrated.   Electronically Signed   By: Burman Nieves M.D.   On: 06/05/2014 22:08    Microbiology: Recent Results (from the past 240 hour(s))  CULTURE, BLOOD (ROUTINE X 2)     Status: None   Collection Time    06/05/14  8:36 PM      Result Value Ref Range Status   Specimen Description BLOOD RIGHT FOREARM   Final   Special Requests BOTTLES DRAWN AEROBIC AND ANAEROBIC 4CC   Final   Culture  Setup Time     Final   Value:  06/06/2014 01:26     Performed at [facility]   Culture     Final   Value:        BLOOD CULTURE RECEIVED NO GROWTH TO DATE CULTURE WILL BE HELD FOR 5 DAYS BEFORE ISSUING A FINAL NEGATIVE REPORT     Performed at [facility]   Report Status PENDING   Incomplete  CULTURE, BLOOD (ROUTINE X 2)     Status: None   Collection Time    06/05/14 10:18 PM      Result Value Ref Range Status   Specimen Description BLOOD LEFT ANTECUBITAL   Final   Special Requests BOTTLES DRAWN AEROBIC AND ANAEROBIC 5CC   Final   Culture  Setup Time     Final   Value: 06/06/2014 03:23     Performed at [facility]   Culture     Final   Value:        BLOOD CULTURE RECEIVED NO GROWTH TO DATE CULTURE WILL BE HELD FOR 5 DAYS BEFORE ISSUING A FINAL NEGATIVE REPORT     Performed at [facility]   Report Status PENDING   Incomplete  CULTURE, BLOOD (ROUTINE X 2)     Status: None   Collection Time    06/05/14 10:23 PM      Result Value Ref Range Status   Specimen Description BLOOD RIGHT HAND   Final   Special Requests BOTTLES DRAWN AEROBIC AND ANAEROBIC 5CC   Final   Culture  Setup Time     Final   Value: 06/06/2014 01:25     Performed at [facility]   Culture     Final   Value:        BLOOD CULTURE RECEIVED NO GROWTH TO DATE CULTURE WILL BE HELD FOR 5 DAYS BEFORE ISSUING A FINAL NEGATIVE REPORT     Performed at [facility]   Report Status PENDING   Incomplete  MRSA PCR SCREENING     Status: None   Collection Time    06/06/14 12:30 AM      Result Value Ref Range Status   MRSA by PCR NEGATIVE  NEGATIVE Final   Comment:            The GeneXpert MRSA Assay (FDA     approved for NASAL specimens     only), is one component of a     comprehensive MRSA colonization     surveillance program. It is not     intended to diagnose MRSA     infection nor to guide or     monitor treatment for     MRSA infections.  CLOSTRIDIUM DIFFICILE BY  PCR     Status: None   Collection  Time    06/06/14  4:27 PM      Result Value Ref Range Status   C difficile by pcr NEGATIVE  NEGATIVE Final   Comment: Performed at [facility]     Labs: Basic Metabolic Panel:  Recent Labs Lab 06/06/14 0840 06/06/14 1229 06/06/14 2108 06/07/14 0441 06/08/14 0453  NA 142 141 138 139 141  K 3.7 3.6* 3.4* 4.2 3.5*  CL 108 107 105 107 111  CO2 15* 18* 18* 17* 19  GLUCOSE 159* 156* 126* 119* 105*  BUN 109* 104* 92* 83* 37*  CREATININE 3.50* 3.07* 2.15* 1.69* 0.93  CALCIUM 8.7 8.5 8.5 8.2* 8.2*   Liver Function Tests:  Recent Labs Lab 06/05/14 2036 06/06/14 0440  AST 14 20  ALT 10 11  ALKPHOS 89 78  BILITOT 0.4 0.3  PROT 7.9 7.0  ALBUMIN 4.1 3.6   CBC:  Recent Labs Lab 06/05/14 2036 06/06/14 0440 06/07/14 0441  WBC 17.3* 15.9* 8.9  NEUTROABS 13.5* 12.3*  --   HGB 15.5 13.9 11.7*  HCT 45.9 42.0 33.2*  MCV 97.2 97.4 93.5  PLT 308 269 219   Cardiac Enzymes:  Recent Labs Lab 06/06/14 0440  CKTOTAL 488*  TROPONINI <0.30   CBG:  Recent Labs Lab 06/07/14 0733 06/07/14 1225 06/07/14 1635 06/07/14 2140 06/08/14 0729  GLUCAP 111* 102* 155* 111* 87   Signed:  Larkin Morelos  [facility] 06/08/2014, 11:20 AM

## 2014-06-08 NOTE — Progress Notes (Signed)
Patient is set to discharge back to [facility] today. Patient aware & CSW left message for patient's daughter, Felecia Shelling. Discharge packet in [facility] - RN, Pam/Bethany aware. [facility] called for transport.    Lincoln Maxin, LCSW [facility] Clinical Social Worker cell #: [phone]

## 2014-06-12 LAB — CULTURE, BLOOD (ROUTINE X 2)
CULTURE: NO GROWTH
CULTURE: NO GROWTH
Culture: NO GROWTH

## 2014-07-03 ENCOUNTER — Ambulatory Visit: Payer: Self-pay | Admitting: Neurology

## 2014-07-29 ENCOUNTER — Emergency Department (HOSPITAL_COMMUNITY): Payer: Medicare Other

## 2014-07-29 ENCOUNTER — Inpatient Hospital Stay (HOSPITAL_COMMUNITY)
Admission: EM | Admit: 2014-07-29 | Discharge: 2014-08-01 | DRG: 871 | Disposition: A | Discharge status: Skilled Nursing Facility | Payer: Medicare Other | Attending: Internal Medicine | Admitting: Internal Medicine

## 2014-07-29 ENCOUNTER — Encounter (HOSPITAL_COMMUNITY): Payer: Self-pay | Admitting: Emergency Medicine

## 2014-07-29 DIAGNOSIS — Z87891 Personal history of nicotine dependence: Secondary | ICD-10-CM | POA: Diagnosis not present

## 2014-07-29 DIAGNOSIS — Z23 Encounter for immunization: Secondary | ICD-10-CM | POA: Diagnosis not present

## 2014-07-29 DIAGNOSIS — I69959 Hemiplegia and hemiparesis following unspecified cerebrovascular disease affecting unspecified side: Secondary | ICD-10-CM

## 2014-07-29 DIAGNOSIS — A419 Sepsis, unspecified organism: Principal | ICD-10-CM | POA: Diagnosis present

## 2014-07-29 DIAGNOSIS — G9340 Encephalopathy, unspecified: Secondary | ICD-10-CM | POA: Diagnosis present

## 2014-07-29 DIAGNOSIS — I69322 Dysarthria following cerebral infarction: Secondary | ICD-10-CM

## 2014-07-29 DIAGNOSIS — N390 Urinary tract infection, site not specified: Secondary | ICD-10-CM | POA: Diagnosis not present

## 2014-07-29 DIAGNOSIS — E119 Type 2 diabetes mellitus without complications: Secondary | ICD-10-CM | POA: Diagnosis not present

## 2014-07-29 DIAGNOSIS — Z79899 Other long term (current) drug therapy: Secondary | ICD-10-CM | POA: Diagnosis not present

## 2014-07-29 DIAGNOSIS — R410 Disorientation, unspecified: Secondary | ICD-10-CM

## 2014-07-29 DIAGNOSIS — D638 Anemia in other chronic diseases classified elsewhere: Secondary | ICD-10-CM | POA: Diagnosis not present

## 2014-07-29 DIAGNOSIS — E785 Hyperlipidemia, unspecified: Secondary | ICD-10-CM | POA: Diagnosis present

## 2014-07-29 DIAGNOSIS — R1310 Dysphagia, unspecified: Secondary | ICD-10-CM | POA: Diagnosis present

## 2014-07-29 DIAGNOSIS — E872 Acidosis: Secondary | ICD-10-CM | POA: Diagnosis present

## 2014-07-29 DIAGNOSIS — I10 Essential (primary) hypertension: Secondary | ICD-10-CM | POA: Diagnosis present

## 2014-07-29 DIAGNOSIS — E876 Hypokalemia: Secondary | ICD-10-CM | POA: Diagnosis not present

## 2014-07-29 HISTORY — DX: Cerebral infarction, unspecified: I63.9

## 2014-07-29 LAB — COMPREHENSIVE METABOLIC PANEL
ALT: 14 U/L (ref 0–53)
AST: 15 U/L (ref 0–37)
Albumin: 3.1 g/dL — ABNORMAL LOW (ref 3.5–5.2)
Alkaline Phosphatase: 69 U/L (ref 39–117)
Anion gap: 16 — ABNORMAL HIGH (ref 5–15)
BUN: 9 mg/dL (ref 6–23)
CALCIUM: 8.9 mg/dL (ref 8.4–10.5)
CO2: 19 meq/L (ref 19–32)
Chloride: 102 mEq/L (ref 96–112)
Creatinine, Ser: 0.55 mg/dL (ref 0.50–1.35)
GLUCOSE: 152 mg/dL — AB (ref 70–99)
Potassium: 3.6 mEq/L — ABNORMAL LOW (ref 3.7–5.3)
Sodium: 137 mEq/L (ref 137–147)
Total Bilirubin: 0.2 mg/dL — ABNORMAL LOW (ref 0.3–1.2)
Total Protein: 7.4 g/dL (ref 6.0–8.3)

## 2014-07-29 LAB — DIFFERENTIAL
Basophils Absolute: 0 10*3/uL (ref 0.0–0.1)
Basophils Relative: 0 % (ref 0–1)
EOS PCT: 0 % (ref 0–5)
Eosinophils Absolute: 0 10*3/uL (ref 0.0–0.7)
LYMPHS PCT: 7 % — AB (ref 12–46)
Lymphs Abs: 1 10*3/uL (ref 0.7–4.0)
Monocytes Absolute: 1.2 10*3/uL — ABNORMAL HIGH (ref 0.1–1.0)
Monocytes Relative: 9 % (ref 3–12)
Neutro Abs: 11.8 10*3/uL — ABNORMAL HIGH (ref 1.7–7.7)
Neutrophils Relative %: 84 % — ABNORMAL HIGH (ref 43–77)

## 2014-07-29 LAB — CBC
HEMATOCRIT: 30.9 % — AB (ref 39.0–52.0)
Hemoglobin: 10.6 g/dL — ABNORMAL LOW (ref 13.0–17.0)
MCH: 32.1 pg (ref 26.0–34.0)
MCHC: 34.3 g/dL (ref 30.0–36.0)
MCV: 93.6 fL (ref 78.0–100.0)
Platelets: 545 10*3/uL — ABNORMAL HIGH (ref 150–400)
RBC: 3.3 MIL/uL — ABNORMAL LOW (ref 4.22–5.81)
RDW: 15 % (ref 11.5–15.5)
WBC: 14 10*3/uL — AB (ref 4.0–10.5)

## 2014-07-29 LAB — PROTIME-INR
INR: 1.3 (ref 0.00–1.49)
Prothrombin Time: 16.4 seconds — ABNORMAL HIGH (ref 11.6–15.2)

## 2014-07-29 LAB — URINALYSIS, ROUTINE W REFLEX MICROSCOPIC
Bilirubin Urine: NEGATIVE
GLUCOSE, UA: NEGATIVE mg/dL
KETONES UR: NEGATIVE mg/dL
Nitrite: POSITIVE — AB
Protein, ur: 30 mg/dL — AB
SPECIFIC GRAVITY, URINE: 1.018 (ref 1.005–1.030)
Urobilinogen, UA: 1 mg/dL (ref 0.0–1.0)
pH: 6 (ref 5.0–8.0)

## 2014-07-29 LAB — I-STAT CG4 LACTIC ACID, ED: LACTIC ACID, VENOUS: 1.62 mmol/L (ref 0.5–2.2)

## 2014-07-29 LAB — URINE MICROSCOPIC-ADD ON

## 2014-07-29 LAB — CBG MONITORING, ED: GLUCOSE-CAPILLARY: 121 mg/dL — AB (ref 70–99)

## 2014-07-29 LAB — I-STAT TROPONIN, ED: TROPONIN I, POC: 0.01 ng/mL (ref 0.00–0.08)

## 2014-07-29 LAB — APTT: aPTT: 31 seconds (ref 24–37)

## 2014-07-29 MED ORDER — ASPIRIN-DIPYRIDAMOLE ER 25-200 MG PO CP12
1.0000 | ORAL_CAPSULE | Freq: Two times a day (BID) | ORAL | Status: DC
  Administered 2014-07-30 – 2014-08-01 (×6): 1 via ORAL
  Filled 2014-07-29 (×8): qty 1

## 2014-07-29 MED ORDER — ENOXAPARIN SODIUM 40 MG/0.4ML ~~LOC~~ SOLN
40.0000 mg | SUBCUTANEOUS | Status: DC
  Administered 2014-07-30 – 2014-08-01 (×3): 40 mg via SUBCUTANEOUS
  Filled 2014-07-29 (×5): qty 0.4

## 2014-07-29 MED ORDER — DEXTROSE 5 % IV SOLN
2.0000 g | Freq: Once | INTRAVENOUS | Status: AC
  Administered 2014-07-29: 2 g via INTRAVENOUS
  Filled 2014-07-29: qty 2

## 2014-07-29 MED ORDER — INSULIN ASPART 100 UNIT/ML ~~LOC~~ SOLN
0.0000 [IU] | Freq: Three times a day (TID) | SUBCUTANEOUS | Status: DC
  Administered 2014-07-31: 1 [IU] via SUBCUTANEOUS
  Administered 2014-08-01: 2 [IU] via SUBCUTANEOUS

## 2014-07-29 MED ORDER — DEXTROSE 5 % IV SOLN
1.0000 g | INTRAVENOUS | Status: DC
  Administered 2014-07-30 – 2014-07-31 (×3): 1 g via INTRAVENOUS
  Filled 2014-07-29 (×4): qty 10

## 2014-07-29 MED ORDER — ONDANSETRON HCL 4 MG/2ML IJ SOLN: 4.0000 mg | Freq: Four times a day (QID) | INTRAMUSCULAR | Status: DC | PRN

## 2014-07-29 MED ORDER — PRAVASTATIN SODIUM 40 MG PO TABS
40.0000 mg | ORAL_TABLET | Freq: Every morning | ORAL | Status: DC
  Administered 2014-07-30 – 2014-08-01 (×3): 40 mg via ORAL
  Filled 2014-07-29 (×3): qty 1

## 2014-07-29 MED ORDER — SODIUM CHLORIDE 0.9 % IV SOLN
INTRAVENOUS | Status: AC
  Administered 2014-07-30 (×3): via INTRAVENOUS

## 2014-07-29 MED ORDER — ACETAMINOPHEN 325 MG PO TABS
650.0000 mg | ORAL_TABLET | Freq: Four times a day (QID) | ORAL | Status: DC | PRN
  Administered 2014-07-31: 650 mg via ORAL
  Filled 2014-07-29: qty 2

## 2014-07-29 MED ORDER — SENNA 8.6 MG PO TABS: 1.0000 | ORAL_TABLET | Freq: Every day | ORAL | Status: DC | PRN

## 2014-07-29 MED ORDER — NYSTATIN 100000 UNIT/ML MT SUSP
5.0000 mL | Freq: Four times a day (QID) | OROMUCOSAL | Status: DC
  Administered 2014-07-30 – 2014-07-31 (×5): 500000 [IU] via ORAL
  Filled 2014-07-29 (×14): qty 5

## 2014-07-29 MED ORDER — ACETAMINOPHEN 650 MG RE SUPP
650.0000 mg | RECTAL | Status: AC
  Administered 2014-07-29: 650 mg via RECTAL
  Filled 2014-07-29: qty 1

## 2014-07-29 MED ORDER — CLONIDINE HCL 0.1 MG PO TABS: 0.1000 mg | ORAL_TABLET | Freq: Two times a day (BID) | ORAL | Status: DC | PRN

## 2014-07-29 MED ORDER — POLYETHYLENE GLYCOL 3350 17 G PO PACK: 17.0000 g | PACK | Freq: Every day | ORAL | Status: DC | PRN

## 2014-07-29 MED ORDER — ONDANSETRON HCL 4 MG PO TABS: 4.0000 mg | ORAL_TABLET | Freq: Four times a day (QID) | ORAL | Status: DC | PRN

## 2014-07-29 MED ORDER — DEXTROSE 5 % IV SOLN: 1.0000 g | Freq: Once | INTRAVENOUS | Status: DC

## 2014-07-29 MED ORDER — ACETAMINOPHEN 650 MG RE SUPP: 650.0000 mg | Freq: Four times a day (QID) | RECTAL | Status: DC | PRN

## 2014-07-29 MED ORDER — ACETAMINOPHEN 650 MG RE SUPP: 650.0000 mg | Freq: Once | RECTAL | Status: DC

## 2014-07-29 MED ORDER — SODIUM CHLORIDE 0.9 % IV BOLUS (SEPSIS)
500.0000 mL | Freq: Once | INTRAVENOUS | Status: AC
  Administered 2014-07-29: 500 mL via INTRAVENOUS

## 2014-07-29 MED ORDER — ACETAMINOPHEN 325 MG PO TABS: 650.0000 mg | ORAL_TABLET | Freq: Once | ORAL | Status: DC

## 2014-07-29 NOTE — ED Notes (Signed)
Patient transported to MRI 

## 2014-07-29 NOTE — ED Notes (Signed)
Patient transported to X-ray 

## 2014-07-29 NOTE — Progress Notes (Addendum)
ANTIBIOTIC CONSULT NOTE - INITIAL  Pharmacy Consult for Ceftriaxone  Indication: UTI  No Known Allergies  Vital Signs: Temp: 102.5 F (39.2 C) (10/18 2208) Temp Source: Oral (10/18 2149) BP: 152/66 mmHg (10/18 2208) Pulse Rate: 95 (10/18 2130)  Labs:  Recent Labs  07/29/14 1802  WBC 14.0*  HGB 10.6*  PLT 545*  CREATININE 0.55    Medical History: Past Medical History  Diagnosis Date  . Diabetes mellitus   . Hypertension   . CVA (cerebral infarction)     hx of  . Stroke    Assessment: 68 y/o M to start ceftriaxone for UTI. WBC elevated, other labs as above.   Plan:  -Ceftriaxone 1g IV q24h -Trend WBC, temp, urine culture -pharmacy to sign off as no dose adjustments are needed for renal function  Edgar Corrigan D. Daxtyn Rottenberg, PharmD, BCPS Clinical Pharmacist Pager: [phone] 07/30/2014 1:36 PM

## 2014-07-29 NOTE — ED Provider Notes (Signed)
CSN: [id number]     Arrival date & time 07/29/14  1757 History   First MD Initiated Contact with Patient 07/29/14 1803     Chief Complaint  Patient presents with  . Code Stroke     (Consider location/radiation/quality/duration/timing/severity/associated sxs/prior Treatment) Patient is a 68 y.o. male presenting with neurologic complaint. The history is provided by the patient.  Neurologic Problem This is a new problem. The current episode started 3 to 5 hours ago. The problem occurs constantly. The problem has not changed since onset.Pertinent negatives include no chest pain, no abdominal pain, no headaches and no shortness of breath. Nothing aggravates the symptoms. Nothing relieves the symptoms. He has tried nothing for the symptoms. The treatment provided no relief.    Past Medical History  Diagnosis Date  . Diabetes mellitus   . Hypertension   . CVA (cerebral infarction)     hx of  . Stroke    Past Surgical History  Procedure Laterality Date  . Ganglion cyst excision      s/p removal - wrist in 1965- 07/12/04   Family History  Problem Relation Age of Onset  . Cancer Mother     breast cancer  . Diabetes Father    History  Substance Use Topics  . Smoking status: Former Games developer  . Smokeless tobacco: Not on file  . Alcohol Use: No    Review of Systems  Constitutional: Negative for fever.  HENT: Negative for drooling and rhinorrhea.   Eyes: Negative for pain.  Respiratory: Negative for cough and shortness of breath.   Cardiovascular: Negative for chest pain and leg swelling.  Gastrointestinal: Negative for nausea, vomiting, abdominal pain and diarrhea.  Genitourinary: Negative for dysuria and hematuria.  Musculoskeletal: Negative for gait problem and neck pain.  Skin: Negative for color change.  Neurological: Positive for speech difficulty. Negative for numbness and headaches.  Hematological: Negative for adenopathy.  Psychiatric/Behavioral: Negative for behavioral  problems.  All other systems reviewed and are negative.     Allergies  Review of patient's allergies indicates no known allergies.  Home Medications   Prior to Admission medications   Medication Sig Start Date End Date Taking? Authorizing Provider  cloNIDine (CATAPRES) 0.1 MG tablet Take 0.1 mg by mouth every 12 (twelve) hours as needed (essential hypertension.).    Historical Provider, MD  dipyridamole-aspirin (AGGRENOX) 200-25 MG per 12 hr capsule Take 1 capsule by mouth 2 (two) times daily. 05/11/14   Maryann Mikhail, DO  metFORMIN (GLUCOPHAGE) 1000 MG tablet Take 1,000 mg by mouth 2 (two) times daily with a meal.    Historical Provider, MD  nystatin (MYCOSTATIN) 100000 UNIT/ML suspension Take 5 mLs by mouth 4 (four) times daily.    Historical Provider, MD  polyethylene glycol (MIRALAX / GLYCOLAX) packet Take 17 g by mouth daily as needed (constipation.).    Historical Provider, MD  pravastatin (PRAVACHOL) 40 MG tablet Take 40 mg by mouth every morning.     Historical Provider, MD  senna (SENOKOT) 8.6 MG TABS tablet Take 1 tablet by mouth daily as needed for mild constipation.    Historical Provider, MD   BP 136/65  Pulse 94  Temp(Src) 99 F (37.2 C) (Oral)  Resp 17  SpO2 98% Physical Exam  Nursing note and vitals reviewed. Constitutional: He is oriented to person, place, and time. He appears well-developed and well-nourished.  HENT:  Head: Normocephalic and atraumatic.  Right Ear: External ear normal.  Left Ear: External ear normal.  Nose: Nose normal.  Mouth/Throat: Oropharynx is clear and moist. No oropharyngeal exudate.  Eyes: Conjunctivae and EOM are normal. Pupils are equal, round, and reactive to light.  Neck: Normal range of motion. Neck supple.  Cardiovascular: Normal rate, regular rhythm, normal heart sounds and intact distal pulses.  Exam reveals no gallop and no friction rub.   No murmur heard. Pulmonary/Chest: Effort normal and breath sounds normal. No  respiratory distress. He has no wheezes.  Abdominal: Soft. Bowel sounds are normal. He exhibits no distension. There is no tenderness. There is no rebound and no guarding.  Musculoskeletal: Normal range of motion. He exhibits no edema and no tenderness.  A/o x 3. Mild slurring of speech.   Normal sensation throughout.   1/5 strength in LUE/LLE.   5/5 in RUE. 2/5 in RLE.   Neurological: He is alert and oriented to person, place, and time.  Skin: Skin is warm and dry.  Psychiatric: He has a normal mood and affect. His behavior is normal.    ED Course  Procedures (including critical care time) Labs Review Labs Reviewed  PROTIME-INR - Abnormal; Notable for the following:    Prothrombin Time 16.4 (*)    All other components within normal limits  CBC - Abnormal; Notable for the following:    WBC 14.0 (*)    RBC 3.30 (*)    Hemoglobin 10.6 (*)    HCT 30.9 (*)    Platelets 545 (*)    All other components within normal limits  DIFFERENTIAL - Abnormal; Notable for the following:    Neutrophils Relative % 84 (*)    Neutro Abs 11.8 (*)    Lymphocytes Relative 7 (*)    Monocytes Absolute 1.2 (*)    All other components within normal limits  COMPREHENSIVE METABOLIC PANEL - Abnormal; Notable for the following:    Potassium 3.6 (*)    Glucose, Bld 152 (*)    Albumin 3.1 (*)    Total Bilirubin 0.2 (*)    Anion gap 16 (*)    All other components within normal limits  URINALYSIS, ROUTINE W REFLEX MICROSCOPIC - Abnormal; Notable for the following:    APPearance TURBID (*)    Hgb urine dipstick MODERATE (*)    Protein, ur 30 (*)    Nitrite POSITIVE (*)    Leukocytes, UA LARGE (*)    All other components within normal limits  URINE MICROSCOPIC-ADD ON - Abnormal; Notable for the following:    Bacteria, UA MANY (*)    All other components within normal limits  CBG MONITORING, ED - Abnormal; Notable for the following:    Glucose-Capillary 121 (*)    All other components within normal  limits  CULTURE, BLOOD (ROUTINE X 2)  CULTURE, BLOOD (ROUTINE X 2)  APTT  COMPREHENSIVE METABOLIC PANEL  CBC WITH DIFFERENTIAL  CBC  I-STAT TROPOININ, ED  CBG MONITORING, ED  I-STAT CG4 LACTIC ACID, ED    Imaging Review Ct Head (brain) Wo Contrast  07/29/2014   CLINICAL DATA:  68 year old male code stroke, increased slurred speech and left side defects. Initial encounter.  EXAM: CT HEAD WITHOUT CONTRAST  TECHNIQUE: Contiguous axial images were obtained from the base of the skull through the vertex without intravenous contrast.  COMPARISON:  Head CT 06/05/2014 and earlier.  FINDINGS: No ventriculomegaly. No midline shift, mass effect, or evidence of intracranial mass lesion. No acute intracranial hemorrhage identified.  Multifocal chronic bilateral cortical infarcts plus lacunar infarcts in the right deep gray matter nuclei and brainstem. No  evidence of cortically based acute infarction identified. Calcified atherosclerosis at the skull base. No suspicious intracranial vascular hyperdensity.  No acute osseous abnormality identified. Stable paranasal sinuses and mastoids. No acute orbit or scalp soft tissue findings.  IMPRESSION: Stable non contrast CT appearance of the brain. Chronic small and medium-sized vessel ischemia.  Study discussed by telephone with Dr. Ritta SlotMcNeill Kirkpatrick on 07/29/2014 at 1815 hrs.   Electronically Signed   By: Augusto GambleLee  Hall M.D.   On: 07/29/2014 18:18     EKG Interpretation   Date/Time:  Sunday July 29 2014 18:13:47 EDT Ventricular Rate:  96 PR Interval:  231 QRS Duration: 100 QT Interval:  374 QTC Calculation: 473 R Axis:   54 Text Interpretation:  Sinus rhythm Prolonged PR interval Borderline low  voltage, extremity leads No significant change since last tracing  Confirmed by Miabella Shannahan  MD, Royale Swamy ([id number]) on 07/29/2014 6:35:21 PM      MDM   Final diagnoses:  UTI (lower urinary tract infection)  Confusion  Sepsis, due to unspecified organism    6:30  PM 68 y.o. male w hx of CVA, DM, HTN who pw slurring of speech and leaning to the right. Last seen normal is thought to be 3 PM although there is some question about this. The patient appears to have some continued slurring of speech on exam. EMS is reporting that the patient was febrile at the facility but he is afebrile here. The patient denies any pain in his alert and oriented x3. He does have left-sided weakness from a previous stroke which does not appear significantly changed. Neurology has evaluated the patient. Will get screening labs and imaging.  MRI neg for CVA but pt meets sepsis criteria w/ UTI. Will admit to hospitalist. Given cefepime.     Purvis SheffieldForrest Francisca Harbuck, MD 07/30/14 [phone]

## 2014-07-29 NOTE — Consult Note (Signed)
Neurology Consultation Reason for Consult: Left-sided weakness Referring Physician: Ricarda FrameHarrison, F.  CC: Dysarthria  History is obtained from: Patient  HPI: Tony Johnson is a 68 y.o. male with a history of previous stroke in July who has residual left-sided weakness from the stroke as well as previous right leg weakness from another stroke. He was in his normal state until he started noticing worsening slurred speech around 1 PM. After shift change, the nurse said that it was very prominent and therefore called EMS.  He got a flu shot 2 days ago, but otherwise denies any symptoms of illness.  LKW: 1 PM tpa given?: no, outside of window, recent stroke    ROS: A 14 point ROS was performed and is negative except as noted in the HPI.   Past Medical History  Diagnosis Date  . Diabetes mellitus   . Hypertension   . CVA (cerebral infarction)     hx of  . Stroke     Family History: Father-diabetes  Social History: Tob: Former smoker  Exam: Current vital signs: BP 138/65  Pulse 92  Temp(Src) 101.3 F (38.5 C) (Rectal)  Resp 14  SpO2 99% Vital signs in last 24 hours: Temp:  [99 F (37.2 C)-101.3 F (38.5 C)] 101.3 F (38.5 C) (10/18 1836) Pulse Rate:  [92-96] 92 (10/18 1830) Resp:  [14-25] 14 (10/18 1830) BP: (136-138)/(65) 138/65 mmHg (10/18 1830) SpO2:  [98 %-99 %] 99 % (10/18 1830)  General: In bed, NAD CV: Regular rate and rhythm Mental Status: Patient is awake, alert, oriented to person, place, month, year, and situation. Immediate and remote memory are intact. Patient is able to give a clear and coherent history. No signs of aphasia or neglect Cranial Nerves: II: Visual Fields are full. Pupils are equal, round, and reactive to light.  Discs are difficult to visualize. III,IV, VI: EOMI without ptosis or diploplia.  V: Facial sensation is symmetric to temperature VII: Facial movement is left facial weakness VIII: hearing is intact to voice X: Uvula elevates  symmetrically XI: Shoulder shrug is symmetric. XII: tongue is midline without atrophy or fasciculations.  Motor: Tone is normal. Bulk is normal. 5/5 strength was present in the right arm, he has distal greater than proximal weakness of the right leg, completely flaccid left arm, minimal movement left leg Sensory: Sensation is symmetric to light touch and temperature in the arms and legs. Deep Tendon Reflexes: 2+ and symmetric in the biceps and patellae.  Plantars: Toes are downgoing bilaterally.  Cerebellar: Unable to perform on left, finger-nose-finger intact on right, unable to perform heel-knee-shin on right Gait: Not tested due to patient safety concerns.    I have reviewed labs in epic and the results pertinent to this consultation are: Elevated creatinine  I have reviewed the images obtained: CT head-old strokes  Impression: 68 year old male with slight worsening of his previous deficits in the setting of fever. I suspect that this only represents unmasking previous deficits. If an MRI is negative for acute stroke, then no further workup is needed at this time from a neurological perspective.  Recommendations: 1) MRI brain 2) if negative for acute stroke then would have no further recommendations and neurology will sign off.   Ritta SlotMcNeill Hardy Harcum, MD [facility] [phone]  If 7pm- 7am, please page neurology on call as listed in AMION.

## 2014-07-29 NOTE — ED Notes (Signed)
[facility] with worsening slurred speech and unchanged left sided weakness. LKW was 1 pm but discovered at 3pm. Pt has prev stroke 3 months prior with residual on left side.

## 2014-07-29 NOTE — H&P (Signed)
[facility] History and Physical  Tony Johnson JWJ:[id number] DOB: 1945-12-30 DOA: 07/29/2014  Referring physician: ER physician. PCP: No primary provider on file.   Chief Complaint: Dysarthria fever and confusion.  HPI: Tony Johnson is a 68 y.o. male with history of recent stroke was brought to the ER because of dysarthria and confusion with fever. On-call neurologist was consulted and patient had MRI of the brain which did not show any acute stroke. Patient was found to be febrile and UA were showing features consistent with UTI. Patient's symptoms are probably from UTI and has been started on empiric antibiotics after urine cultures and blood cultures have been obtained. Patient denies any chest pain shortness of breath nausea vomiting abdominal pain or diarrhea.   Review of Systems: As presented in the history of presenting illness, rest negative.  Past Medical History  Diagnosis Date  . Diabetes mellitus   . Hypertension   . CVA (cerebral infarction)     hx of  . Stroke    Past Surgical History  Procedure Laterality Date  . Ganglion cyst excision      s/p removal - wrist in 1965- 07/12/04   Social History:  reports that he has quit smoking. He does not have any smokeless tobacco history on file. He reports that he does not drink alcohol. His drug history is not on file. Where does patient live nursing home. Can patient participate in ADLs? No.  No Known Allergies  Family History:  Family History  Problem Relation Age of Onset  . Cancer Mother     breast cancer  . Diabetes Father       Prior to Admission medications   Medication Sig Start Date End Date Taking? Authorizing Provider  cloNIDine (CATAPRES) 0.1 MG tablet Take 0.1 mg by mouth every 12 (twelve) hours as needed (essential hypertension.).   Yes Historical Provider, MD  dipyridamole-aspirin (AGGRENOX) 200-25 MG per 12 hr capsule Take 1 capsule by mouth 2 (two) times daily. 05/11/14  Yes Maryann  Mikhail, DO  ENSURE PLUS (ENSURE PLUS) LIQD Take 237 mLs by mouth 3 (three) times daily.   Yes Historical Provider, MD  metFORMIN (GLUCOPHAGE) 1000 MG tablet Take 1,000 mg by mouth 2 (two) times daily with a meal.   Yes Historical Provider, MD  nystatin (MYCOSTATIN) 100000 UNIT/ML suspension Take 5 mLs by mouth 4 (four) times daily. For thrush. Swish and swallow   Yes Historical Provider, MD  polyethylene glycol (MIRALAX / GLYCOLAX) packet Take 17 g by mouth daily as needed (constipation.).   Yes Historical Provider, MD  pravastatin (PRAVACHOL) 40 MG tablet Take 40 mg by mouth every morning.    Yes Historical Provider, MD  senna (SENOKOT) 8.6 MG TABS tablet Take 1 tablet by mouth daily as needed for mild constipation.   Yes Historical Provider, MD    Physical Exam: Filed Vitals:   07/29/14 1836 07/29/14 2000 07/29/14 2008 07/29/14 2019  BP:  141/69 141/69   Pulse:  90    Temp: 101.3 F (38.5 C)   101.9 F (38.8 C)  TempSrc: Rectal     Resp:  26 26   SpO2:  97% 99%      General:  Well developed and nourished.  Eyes: Anicteric no pallor.  ENT: No discharge from ears eyes nose mouth.  Neck: No mass felt.  Cardiovascular: S1-S2 heard.  Respiratory: No rhonchi or crepitations.  Abdomen: Soft nontender bowel sounds present. No guarding or rigidity.  Skin: No rash.  Musculoskeletal: No edema.  Psychiatric: Patient is alert and awake.  Neurologic: Alert awake oriented to name and place. Left hemiplegia.  Labs on Admission:  Basic Metabolic Panel:  Recent Labs Lab 07/29/14 1802  NA 137  K 3.6*  CL 102  CO2 19  GLUCOSE 152*  BUN 9  CREATININE 0.55  CALCIUM 8.9   Liver Function Tests:  Recent Labs Lab 07/29/14 1802  AST 15  ALT 14  ALKPHOS 69  BILITOT 0.2*  PROT 7.4  ALBUMIN 3.1*   No results found for this basename: LIPASE, AMYLASE,  in the last 168 hours No results found for this basename: AMMONIA,  in the last 168 hours CBC:  Recent Labs Lab  07/29/14 1802  WBC 14.0*  NEUTROABS 11.8*  HGB 10.6*  HCT 30.9*  MCV 93.6  PLT 545*   Cardiac Enzymes: No results found for this basename: CKTOTAL, CKMB, CKMBINDEX, TROPONINI,  in the last 168 hours  BNP (last 3 results) No results found for this basename: PROBNP,  in the last 8760 hours CBG:  Recent Labs Lab 07/29/14 1945  GLUCAP 121*    Radiological Exams on Admission: Dg Chest 2 View  07/29/2014   CLINICAL DATA:  Slurred speech, left-sided weakness  EXAM: CHEST  2 VIEW  COMPARISON:  06/05/2014  FINDINGS: The heart size and mediastinal contours are within normal limits. Both lungs are clear. The visualized skeletal structures are unremarkable.  IMPRESSION: No active cardiopulmonary disease.   Electronically Signed   By: Elige KoHetal  Patel   On: 07/29/2014 20:46   Ct Head (brain) Wo Contrast  07/29/2014   CLINICAL DATA:  68 year old male code stroke, increased slurred speech and left side defects. Initial encounter.  EXAM: CT HEAD WITHOUT CONTRAST  TECHNIQUE: Contiguous axial images were obtained from the base of the skull through the vertex without intravenous contrast.  COMPARISON:  Head CT 06/05/2014 and earlier.  FINDINGS: No ventriculomegaly. No midline shift, mass effect, or evidence of intracranial mass lesion. No acute intracranial hemorrhage identified.  Multifocal chronic bilateral cortical infarcts plus lacunar infarcts in the right deep gray matter nuclei and brainstem. No evidence of cortically based acute infarction identified. Calcified atherosclerosis at the skull base. No suspicious intracranial vascular hyperdensity.  No acute osseous abnormality identified. Stable paranasal sinuses and mastoids. No acute orbit or scalp soft tissue findings.  IMPRESSION: Stable non contrast CT appearance of the brain. Chronic small and medium-sized vessel ischemia.  Study discussed by telephone with Dr. Ritta SlotMcNeill Kirkpatrick on 07/29/2014 at 1815 hrs.   Electronically Signed   By: Augusto GambleLee  Hall  M.D.   On: 07/29/2014 18:18   Mr Brain Wo Contrast  07/29/2014   CLINICAL DATA:  New onset weakness and confusion. Personal history of a brainstem stroke in July. Slurred speech beginning several hr ago.  EXAM: MRI HEAD WITHOUT CONTRAST  TECHNIQUE: Multiplanar, multiecho pulse sequences of the brain and surrounding structures were obtained without intravenous contrast.  COMPARISON:  Head CT same day.  MRI 05/08/2014.  FINDINGS: Diffusion imaging does not show any acute or subacute infarction. There are extensive old pontine infarctions. No focal cerebellar insult. There is old infarction in the right basal ganglia and the right thalamus. There chronic small-vessel ischemic changes affecting the deep white matter. There is an old cortical and subcortical infarction in the left frontal lobe and right parietal lobe. No evidence of neoplastic mass lesion, acute hemorrhage, hydrocephalus or extra-axial collection. There is some residual hemosiderin deposition of the sided the old infarctions.  No pituitary mass. No hydrocephalus. No extra-axial fluid collection. No skull or skullbase lesion.  IMPRESSION: No acute or subacute insult. Extensive chronic ischemic changes throughout the brain as outlined above.   Electronically Signed   By: Paulina FusiMark  Shogry M.D.   On: 07/29/2014 20:07    EKG: Independently reviewed. Normal sinus rhythm.  Assessment/Plan Principal Problem:   Acute encephalopathy Active Problems:   Essential hypertension   Hemiplegia, late effect of cerebrovascular disease   UTI (lower urinary tract infection)   Sepsis   1. Acute encephalopathy with possible developing sepsis secondary to UTI - follow blood cultures and urine cultures. Continue with gentle hydration. Continue antibiotics. 2. Diabetes mellitus - patient will be on sliding-scale while inpatient and hold metformin. 3. Hypertension - continue home medications. 4. Metabolic acidosis - has chronic metabolic acidosis. Closely follow  metabolic panel after hydration. 5. Hyperlipidemia - continue statins. 6. History of stroke with left hemiplegia - on Aggrenox. 7. Chronic anemia - follow CBC.    Code Status: Full code.  Family Communication: None.  Disposition Plan: Admit to inpatient.    KAKRAKANDY,ARSHAD N. [facility] Pager [phone].  If 7PM-7AM, please contact night-coverage [email or web address] Password [email or web address] 07/29/2014, 9:31 PM

## 2014-07-29 NOTE — ED Notes (Signed)
CBG= 121

## 2014-07-30 LAB — CBC WITH DIFFERENTIAL/PLATELET
BASOS PCT: 0 % (ref 0–1)
Basophils Absolute: 0 10*3/uL (ref 0.0–0.1)
Eosinophils Absolute: 0 10*3/uL (ref 0.0–0.7)
Eosinophils Relative: 0 % (ref 0–5)
HEMATOCRIT: 27 % — AB (ref 39.0–52.0)
Hemoglobin: 9.6 g/dL — ABNORMAL LOW (ref 13.0–17.0)
LYMPHS PCT: 11 % — AB (ref 12–46)
Lymphs Abs: 1.6 10*3/uL (ref 0.7–4.0)
MCH: 32.2 pg (ref 26.0–34.0)
MCHC: 35.6 g/dL (ref 30.0–36.0)
MCV: 90.6 fL (ref 78.0–100.0)
MONO ABS: 1.7 10*3/uL — AB (ref 0.1–1.0)
Monocytes Relative: 11 % (ref 3–12)
NEUTROS ABS: 11.6 10*3/uL — AB (ref 1.7–7.7)
NEUTROS PCT: 78 % — AB (ref 43–77)
Platelets: 524 10*3/uL — ABNORMAL HIGH (ref 150–400)
RBC: 2.98 MIL/uL — ABNORMAL LOW (ref 4.22–5.81)
RDW: 15.1 % (ref 11.5–15.5)
WBC: 14.8 10*3/uL — AB (ref 4.0–10.5)

## 2014-07-30 LAB — GLUCOSE, CAPILLARY
GLUCOSE-CAPILLARY: 106 mg/dL — AB (ref 70–99)
GLUCOSE-CAPILLARY: 109 mg/dL — AB (ref 70–99)
GLUCOSE-CAPILLARY: 97 mg/dL (ref 70–99)
Glucose-Capillary: 107 mg/dL — ABNORMAL HIGH (ref 70–99)

## 2014-07-30 LAB — COMPREHENSIVE METABOLIC PANEL
ALK PHOS: 62 U/L (ref 39–117)
ALT: 10 U/L (ref 0–53)
ANION GAP: 13 (ref 5–15)
AST: 11 U/L (ref 0–37)
Albumin: 2.8 g/dL — ABNORMAL LOW (ref 3.5–5.2)
BUN: 8 mg/dL (ref 6–23)
CHLORIDE: 106 meq/L (ref 96–112)
CO2: 22 meq/L (ref 19–32)
Calcium: 8.7 mg/dL (ref 8.4–10.5)
Creatinine, Ser: 0.59 mg/dL (ref 0.50–1.35)
GFR calc non Af Amer: >90 mL/min (ref >90)
Glucose, Bld: 104 mg/dL — ABNORMAL HIGH (ref 70–99)
POTASSIUM: 3.7 meq/L (ref 3.7–5.3)
Sodium: 141 mEq/L (ref 137–147)
Total Bilirubin: 0.3 mg/dL (ref 0.3–1.2)
Total Protein: 6.6 g/dL (ref 6.0–8.3)

## 2014-07-30 MED ORDER — PNEUMOCOCCAL VAC POLYVALENT 25 MCG/0.5ML IJ INJ
0.5000 mL | INJECTION | INTRAMUSCULAR | Status: AC
  Administered 2014-07-31: 0.5 mL via INTRAMUSCULAR
  Filled 2014-07-30: qty 0.5

## 2014-07-30 MED ORDER — INFLUENZA VAC SPLIT QUAD 0.5 ML IM SUSY
0.5000 mL | PREFILLED_SYRINGE | INTRAMUSCULAR | Status: AC
  Administered 2014-07-31: 0.5 mL via INTRAMUSCULAR
  Filled 2014-07-30: qty 0.5

## 2014-07-30 NOTE — Evaluation (Signed)
Clinical/Bedside Swallow Evaluation Patient Details  Name: Tony Johnson MRN: [id number] Date of Birth: 1946-03-12  Today's Date: 07/30/2014 Time: 1435-1450 SLP Time Calculation (min): 15 min  Past Medical History:  Past Medical History  Diagnosis Date  . Diabetes mellitus   . Hypertension   . CVA (cerebral infarction)     hx of  . Stroke    Past Surgical History:  Past Surgical History  Procedure Laterality Date  . Ganglion cyst excision      s/p removal - wrist in 1965- 07/12/04   HPI:  68 y.o. male with history of recent stroke was brought to the ER because of dysarthria and confusion with fever.  UA were showing features consistent with UTI.  CXR No active cardiopulmonary disease.  BSE performed 06/06/14 revealed oral delays, coughing iwth water, suspect chronic aspiration, recommendations were for regular diet; thin liquids with possible aspiration risks.   Assessment / Plan / Recommendation Clinical Impression  Pt. exhibited oral impairments including prolonged transit (holdiong bolus 5-10 seconds) with implications of possible permature spill into pharynx or larynx.  Per prior bedside swallow, pt. has history of oral dysphagia.  No overt s/s aspiration, however unable to objectively detect delayed swallow initiation by observation.  He functionally masticated cracker with adequate transit without residue.  Given current diagnosis is UTI and CXR negative for infection, recommend pt. resume thin and Dys 3 diet (endentulous and for energy conservation), pills whole in puree, small sips straw allowed (educated on clinical reasoning why cup may be safer) and sit upright.  He is at high aspiration risk with history of CVA, dysarthria and suspected delayed cognitive processing.  No further ST needed.     Aspiration Risk  Moderate    Diet Recommendation Dysphagia 3 (Mechanical Soft);Thin liquid   Liquid Administration via: Cup;Straw Medication Administration: Whole meds with  puree Supervision: Patient able to self feed;Staff to assist with self feeding;Full supervision/cueing for compensatory strategies Compensations: Slow rate;Small sips/bites;Check for pocketing Postural Changes and/or Swallow Maneuvers: Seated upright 90 degrees    Other  Recommendations Oral Care Recommendations: Oral care BID   Follow Up Recommendations  None    Frequency and Duration        Pertinent Vitals/Pain No evidence pain         Swallow Study         Oral/Motor/Sensory Function Overall Oral Motor/Sensory Function: Impaired at baseline (left sided weakness?ROM)   Ice Chips Ice chips: Not tested   Thin Liquid Thin Liquid: Impaired Presentation: Cup;Straw Oral Phase Impairments: Impaired anterior to posterior transit Oral Phase Functional Implications: Prolonged oral transit Pharyngeal  Phase Impairments:  (audible swallow)    Nectar Thick Nectar Thick Liquid: Not tested   Honey Thick Honey Thick Liquid: Not tested   Puree Puree:  (pt. refused)   Solid   GO    Solid: Within functional limits       Tony Johnson, Tony Johnson 07/30/2014,3:01 PM  Breck CoonsLisa Johnson Lonell FaceLitaker M.Ed [employer] Pager [phone]

## 2014-07-30 NOTE — Progress Notes (Signed)
[facility] PROGRESS NOTE  Tony GiovanniDarrell W Basulto ZOX:[id number] DOB: 1946/02/16 DOA: 07/29/2014 PCP: No primary provider on file.  Assessment/Plan: Tony Johnson is a 68 y.o. male with history of recent stroke was brought to the ER because of dysarthria and confusion with fever. On-call neurologist was consulted and patient had MRI of the brain which did not show any acute stroke. Patient was found to be febrile and UA were showing features consistent with UTI  1-Acute Encephalopathy, metabolic, infectious: patient presents with confusion and dysarthria.  -Found to have UTI.  -MRI brain negative.   Sepsis, UTI: Patient presents with confusion, fever, tachypnea, mild hypotension. UA with too numerous to count.  Continue with IV fluids, ceftriaxone day 2.   Diabetes mellitus -  sliding-scale while inpatient and hold metformin.  Hypertension - hold  home medications in setting of infection.  Metabolic acidosis - has chronic metabolic acidosis. Closely follow metabolic panel after hydration.  Hyperlipidemia - continue statins.  History of stroke with left hemiplegia - on Aggrenox.  Chronic anemia - follow CBC.     Code Status: Full Code.  Family Communication:  Disposition Plan: Remain inpatient.    Consultants:  Neurology  Procedures:  none  Antibiotics:  Ceftriaxone 10-18  HPI/Subjective: Denies pain, oriented to place and time.   Objective: Filed Vitals:   07/30/14 0540  BP: 116/43  Pulse: 72  Temp: 99.4 F (37.4 C)  Resp: 22   No intake or output data in the 24 hours ending 07/30/14 0742 Filed Weights   07/29/14 2208  Weight: 77.656 kg (171 lb 3.2 oz)    Exam:   General:  Alert , slow respond time.   Cardiovascular: S 1, S 2 RRR  Respiratory: CTA  Abdomen: BS present, soft, NT  Musculoskeletal: no edema  Neuro; alert , oriented to place and year, left side hemiplegia.   Data Reviewed: Basic Metabolic Panel:  Recent Labs Lab  07/29/14 1802  NA 137  K 3.6*  CL 102  CO2 19  GLUCOSE 152*  BUN 9  CREATININE 0.55  CALCIUM 8.9   Liver Function Tests:  Recent Labs Lab 07/29/14 1802  AST 15  ALT 14  ALKPHOS 69  BILITOT 0.2*  PROT 7.4  ALBUMIN 3.1*   No results found for this basename: LIPASE, AMYLASE,  in the last 168 hours No results found for this basename: AMMONIA,  in the last 168 hours CBC:  Recent Labs Lab 07/29/14 1802  WBC 14.0*  NEUTROABS 11.8*  HGB 10.6*  HCT 30.9*  MCV 93.6  PLT 545*   Cardiac Enzymes: No results found for this basename: CKTOTAL, CKMB, CKMBINDEX, TROPONINI,  in the last 168 hours BNP (last 3 results) No results found for this basename: PROBNP,  in the last 8760 hours CBG:  Recent Labs Lab 07/29/14 1945  GLUCAP 121*    No results found for this or any previous visit (from the past 240 hour(s)).   Studies: Dg Chest 2 View  07/29/2014   CLINICAL DATA:  Slurred speech, left-sided weakness  EXAM: CHEST  2 VIEW  COMPARISON:  06/05/2014  FINDINGS: The heart size and mediastinal contours are within normal limits. Both lungs are clear. The visualized skeletal structures are unremarkable.  IMPRESSION: No active cardiopulmonary disease.   Electronically Signed   By: Elige KoHetal  Patel   On: 07/29/2014 20:46   Ct Head (brain) Wo Contrast  07/29/2014   CLINICAL DATA:  68 year old male code stroke, increased slurred speech and left side  defects. Initial encounter.  EXAM: CT HEAD WITHOUT CONTRAST  TECHNIQUE: Contiguous axial images were obtained from the base of the skull through the vertex without intravenous contrast.  COMPARISON:  Head CT 06/05/2014 and earlier.  FINDINGS: No ventriculomegaly. No midline shift, mass effect, or evidence of intracranial mass lesion. No acute intracranial hemorrhage identified.  Multifocal chronic bilateral cortical infarcts plus lacunar infarcts in the right deep gray matter nuclei and brainstem. No evidence of cortically based acute infarction  identified. Calcified atherosclerosis at the skull base. No suspicious intracranial vascular hyperdensity.  No acute osseous abnormality identified. Stable paranasal sinuses and mastoids. No acute orbit or scalp soft tissue findings.  IMPRESSION: Stable non contrast CT appearance of the brain. Chronic small and medium-sized vessel ischemia.  Study discussed by telephone with Dr. Ritta SlotMcNeill Kirkpatrick on 07/29/2014 at 1815 hrs.   Electronically Signed   By: Augusto GambleLee  Hall M.D.   On: 07/29/2014 18:18   Mr Brain Wo Contrast  07/29/2014   CLINICAL DATA:  New onset weakness and confusion. Personal history of a brainstem stroke in July. Slurred speech beginning several hr ago.  EXAM: MRI HEAD WITHOUT CONTRAST  TECHNIQUE: Multiplanar, multiecho pulse sequences of the brain and surrounding structures were obtained without intravenous contrast.  COMPARISON:  Head CT same day.  MRI 05/08/2014.  FINDINGS: Diffusion imaging does not show any acute or subacute infarction. There are extensive old pontine infarctions. No focal cerebellar insult. There is old infarction in the right basal ganglia and the right thalamus. There chronic small-vessel ischemic changes affecting the deep white matter. There is an old cortical and subcortical infarction in the left frontal lobe and right parietal lobe. No evidence of neoplastic mass lesion, acute hemorrhage, hydrocephalus or extra-axial collection. There is some residual hemosiderin deposition of the sided the old infarctions. No pituitary mass. No hydrocephalus. No extra-axial fluid collection. No skull or skullbase lesion.  IMPRESSION: No acute or subacute insult. Extensive chronic ischemic changes throughout the brain as outlined above.   Electronically Signed   By: Paulina FusiMark  Shogry M.D.   On: 07/29/2014 20:07    Scheduled Meds: . cefTRIAXone (ROCEPHIN)  IV  1 g Intravenous Q24H  . dipyridamole-aspirin  1 capsule Oral BID  . enoxaparin (LOVENOX) injection  40 mg Subcutaneous Q24H  .  insulin aspart  0-9 Units Subcutaneous TID WC  . nystatin  5 mL Oral QID  . pravastatin  40 mg Oral q morning - 10a   Continuous Infusions: . sodium chloride 100 mL/hr at 07/30/14 0048    Principal Problem:   Acute encephalopathy Active Problems:   Essential hypertension   Hemiplegia, late effect of cerebrovascular disease   UTI (lower urinary tract infection)   Sepsis    Time spent: 35 minutes.     Hartley Barefootegalado, Sharday Michl A  [facility] Pager [phone]. If 7PM-7AM, please contact night-coverage at [email or web address], password [email or web address] 07/30/2014, 7:42 AM  LOS: 1 day

## 2014-07-31 DIAGNOSIS — A419 Sepsis, unspecified organism: Secondary | ICD-10-CM | POA: Diagnosis not present

## 2014-07-31 LAB — BASIC METABOLIC PANEL
ANION GAP: 14 (ref 5–15)
BUN: 7 mg/dL (ref 6–23)
CALCIUM: 8.8 mg/dL (ref 8.4–10.5)
CO2: 21 mEq/L (ref 19–32)
Chloride: 106 mEq/L (ref 96–112)
Creatinine, Ser: 0.53 mg/dL (ref 0.50–1.35)
GFR calc Af Amer: >90 mL/min (ref >90)
GFR calc non Af Amer: >90 mL/min (ref >90)
Glucose, Bld: 134 mg/dL — ABNORMAL HIGH (ref 70–99)
Potassium: 3.1 mEq/L — ABNORMAL LOW (ref 3.7–5.3)
Sodium: 141 mEq/L (ref 137–147)

## 2014-07-31 LAB — CBC
HCT: 30.1 % — ABNORMAL LOW (ref 39.0–52.0)
Hemoglobin: 10.3 g/dL — ABNORMAL LOW (ref 13.0–17.0)
MCH: 32.1 pg (ref 26.0–34.0)
MCHC: 34.2 g/dL (ref 30.0–36.0)
MCV: 93.8 fL (ref 78.0–100.0)
PLATELETS: 526 10*3/uL — AB (ref 150–400)
RBC: 3.21 MIL/uL — ABNORMAL LOW (ref 4.22–5.81)
RDW: 15.2 % (ref 11.5–15.5)
WBC: 9.4 10*3/uL (ref 4.0–10.5)

## 2014-07-31 LAB — GLUCOSE, CAPILLARY
GLUCOSE-CAPILLARY: 145 mg/dL — AB (ref 70–99)
GLUCOSE-CAPILLARY: 113 mg/dL — AB (ref 70–99)
Glucose-Capillary: 92 mg/dL (ref 70–99)
Glucose-Capillary: 120 mg/dL — ABNORMAL HIGH (ref 70–99)

## 2014-07-31 MED ORDER — POTASSIUM CHLORIDE CRYS ER 20 MEQ PO TBCR
40.0000 meq | EXTENDED_RELEASE_TABLET | Freq: Once | ORAL | Status: AC
  Administered 2014-07-31: 40 meq via ORAL
  Filled 2014-07-31: qty 2

## 2014-07-31 NOTE — Progress Notes (Signed)
[facility] PROGRESS NOTE  Tony Johnson ZOX:[id number] DOB: [birth year] DOA: 07/29/2014 PCP: No primary provider on file.  Assessment/Plan: Tony Johnson is a 68 y.o. male with history of recent stroke was brought to the ER because of dysarthria and confusion with fever. On-call neurologist was consulted and patient had MRI of the brain which did not show any acute stroke. Patient was found to be febrile and UA were showing features consistent with UTI  1-Acute Encephalopathy, metabolic, infectious: patient presents with confusion and dysarthria. Improved.  -Found to have UTI.  -MRI brain negative.   Sepsis, UTI: Patient presents with confusion, fever, tachypnea, mild hypotension. UA with too numerous to count.  Continue with IV fluids, ceftriaxone day 3.  Urine culture was not send on admission. Will order urine culture, but might not be accurate.   Dysphagia ; Patient cough while drinking water. Will ask Speech to re evaluates.   Diabetes mellitus -  sliding-scale while inpatient and hold metformin.  Hypertension - hold  home medications in setting of infection. If BP increase will need to resume BP medications.   Hyperlipidemia - continue statins.  History of stroke with left hemiplegia - on Aggrenox.  Chronic anemia - follow CBC.     Code Status: Full Code.  Family Communication: Care discussed with patient.  Disposition Plan: Remain inpatient. SNF in 1 or 2 days.    Consultants:  Neurology  Procedures:  none  Antibiotics:  Ceftriaxone 10-18  HPI/Subjective: Denies pain, oriented to place and time.   Objective: Filed Vitals:   07/31/14 0634  BP: 134/53  Pulse: 71  Temp: 99 F (37.2 C)  Resp: 17    Intake/Output Summary (Last 24 hours) at 07/31/14 1426 Last data filed at 07/31/14 0900  Gross per 24 hour  Intake   2430 ml  Output    800 ml  Net   1630 ml   Filed Weights   07/29/14 2208 07/31/14 0634  Weight: 77.656 kg (171 lb 3.2 oz)  81.285 kg (179 lb 3.2 oz)    Exam:   General:  Alert , slow respond time.   Cardiovascular: S 1, S 2 RRR  Respiratory: CTA  Abdomen: BS present, soft, NT  Musculoskeletal: no edema  Neuro; alert , oriented to place and year, left side hemiplegia.   Data Reviewed: Basic Metabolic Panel:  Recent Labs Lab 07/29/14 1802 07/30/14 0714 07/31/14 1307  NA 137 141 141  K 3.6* 3.7 3.1*  CL 102 106 106  CO2 19 22 21   GLUCOSE 152* 104* 134*  BUN 9 8 7   CREATININE 0.55 0.59 0.53  CALCIUM 8.9 8.7 8.8   Liver Function Tests:  Recent Labs Lab 07/29/14 1802 07/30/14 0714  AST 15 11  ALT 14 10  ALKPHOS 69 62  BILITOT 0.2* 0.3  PROT 7.4 6.6  ALBUMIN 3.1* 2.8*   No results found for this basename: LIPASE, AMYLASE,  in the last 168 hours No results found for this basename: AMMONIA,  in the last 168 hours CBC:  Recent Labs Lab 07/29/14 1802 07/30/14 0714 07/31/14 1307  WBC 14.0* 14.8* 9.4  NEUTROABS 11.8* 11.6*  --   HGB 10.6* 9.6* 10.3*  HCT 30.9* 27.0* 30.1*  MCV 93.6 90.6 93.8  PLT 545* 524* 526*   Cardiac Enzymes: No results found for this basename: CKTOTAL, CKMB, CKMBINDEX, TROPONINI,  in the last 168 hours BNP (last 3 results) No results found for this basename: PROBNP,  in the last 8760 hours  CBG:  Recent Labs Lab 07/30/14 1145 07/30/14 1749 07/30/14 2311 07/31/14 0804 07/31/14 1247  GLUCAP 106* 109* 97 92 120*    Recent Results (from the past 240 hour(s))  CULTURE, BLOOD (ROUTINE X 2)     Status: None   Collection Time    07/29/14  8:55 PM      Result Value Ref Range Status   Specimen Description BLOOD RIGHT ANTECUBITAL   Final   Special Requests     Final   Value: BOTTLES DRAWN AEROBIC AND ANAEROBIC 5CC BLUE 4CC RED   Culture  Setup Time     Final   Value: 07/30/2014 02:11     Performed at [facility]   Culture     Final   Value:        BLOOD CULTURE RECEIVED NO GROWTH TO DATE CULTURE WILL BE HELD FOR 5 DAYS BEFORE ISSUING A  FINAL NEGATIVE REPORT     Performed at [facility]   Report Status PENDING   Incomplete  CULTURE, BLOOD (ROUTINE X 2)     Status: None   Collection Time    07/29/14  9:03 PM      Result Value Ref Range Status   Specimen Description BLOOD LEFT FOREARM   Final   Special Requests BOTTLES DRAWN AEROBIC AND ANAEROBIC 5CC EA   Final   Culture  Setup Time     Final   Value: 07/30/2014 02:11     Performed at [facility]   Culture     Final   Value:        BLOOD CULTURE RECEIVED NO GROWTH TO DATE CULTURE WILL BE HELD FOR 5 DAYS BEFORE ISSUING A FINAL NEGATIVE REPORT     Performed at [facility]   Report Status PENDING   Incomplete     Studies: Dg Chest 2 View  07/29/2014   CLINICAL DATA:  Slurred speech, left-sided weakness  EXAM: CHEST  2 VIEW  COMPARISON:  06/05/2014  FINDINGS: The heart size and mediastinal contours are within normal limits. Both lungs are clear. The visualized skeletal structures are unremarkable.  IMPRESSION: No active cardiopulmonary disease.   Electronically Signed   By: Elige KoHetal  Patel   On: 07/29/2014 20:46   Ct Head (brain) Wo Contrast  07/29/2014   CLINICAL DATA:  68 year old male code stroke, increased slurred speech and left side defects. Initial encounter.  EXAM: CT HEAD WITHOUT CONTRAST  TECHNIQUE: Contiguous axial images were obtained from the base of the skull through the vertex without intravenous contrast.  COMPARISON:  Head CT 06/05/2014 and earlier.  FINDINGS: No ventriculomegaly. No midline shift, mass effect, or evidence of intracranial mass lesion. No acute intracranial hemorrhage identified.  Multifocal chronic bilateral cortical infarcts plus lacunar infarcts in the right deep gray matter nuclei and brainstem. No evidence of cortically based acute infarction identified. Calcified atherosclerosis at the skull base. No suspicious intracranial vascular hyperdensity.  No acute osseous abnormality identified. Stable paranasal sinuses and  mastoids. No acute orbit or scalp soft tissue findings.  IMPRESSION: Stable non contrast CT appearance of the brain. Chronic small and medium-sized vessel ischemia.  Study discussed by telephone with Dr. Ritta SlotMcNeill Kirkpatrick on 07/29/2014 at 1815 hrs.   Electronically Signed   By: Augusto GambleLee  Hall M.D.   On: 07/29/2014 18:18   Mr Brain Wo Contrast  07/29/2014   CLINICAL DATA:  New onset weakness and confusion. Personal history of a brainstem stroke in July. Slurred speech beginning several  hr ago.  EXAM: MRI HEAD WITHOUT CONTRAST  TECHNIQUE: Multiplanar, multiecho pulse sequences of the brain and surrounding structures were obtained without intravenous contrast.  COMPARISON:  Head CT same day.  MRI 05/08/2014.  FINDINGS: Diffusion imaging does not show any acute or subacute infarction. There are extensive old pontine infarctions. No focal cerebellar insult. There is old infarction in the right basal ganglia and the right thalamus. There chronic small-vessel ischemic changes affecting the deep white matter. There is an old cortical and subcortical infarction in the left frontal lobe and right parietal lobe. No evidence of neoplastic mass lesion, acute hemorrhage, hydrocephalus or extra-axial collection. There is some residual hemosiderin deposition of the sided the old infarctions. No pituitary mass. No hydrocephalus. No extra-axial fluid collection. No skull or skullbase lesion.  IMPRESSION: No acute or subacute insult. Extensive chronic ischemic changes throughout the brain as outlined above.   Electronically Signed   By: Paulina FusiMark  Shogry M.D.   On: 07/29/2014 20:07    Scheduled Meds: . cefTRIAXone (ROCEPHIN)  IV  1 g Intravenous Q24H  . dipyridamole-aspirin  1 capsule Oral BID  . enoxaparin (LOVENOX) injection  40 mg Subcutaneous Q24H  . insulin aspart  0-9 Units Subcutaneous TID WC  . nystatin  5 mL Oral QID  . pravastatin  40 mg Oral q morning - 10a   Continuous Infusions:    Principal Problem:   Acute  encephalopathy Active Problems:   Essential hypertension   Hemiplegia, late effect of cerebrovascular disease   UTI (lower urinary tract infection)   Sepsis    Time spent: 35 minutes.     Tony Barefootegalado, Breelle Hollywood A  [facility] Pager [phone]. If 7PM-7AM, please contact night-coverage at [email or web address], password [email or web address] 07/31/2014, 2:26 PM  LOS: 2 days

## 2014-07-31 NOTE — Care Management Note (Signed)
  Page 1 of 1   07/31/2014     10:25:57 AM CARE MANAGEMENT NOTE 07/31/2014  Patient:  Tony Johnson,Tony Johnson   Account Number:  [id number]  Date Initiated:  07/31/2014  Documentation initiated by:  Tony Johnson,Tony Johnson  Subjective/Objective Assessment:     Action/Plan:   Anticipated DC Date:     Anticipated DC Plan:           Choice offered to / List presented to:             Status of service:   Medicare Important Message given?  YES (If response is "NO", the following Medicare IM given date fields will be blank) Date Medicare IM given:  07/31/2014 Medicare IM given by:  Tony Johnson,Alexzandra Bilton Date Additional Medicare IM given:   Additional Medicare IM given by:    Discharge Disposition:    Per UR Regulation:    If discussed at Long Length of Stay Meetings, dates discussed:    Comments:

## 2014-08-01 LAB — BASIC METABOLIC PANEL
Anion gap: 12 (ref 5–15)
BUN: 7 mg/dL (ref 6–23)
CO2: 21 meq/L (ref 19–32)
Calcium: 8.4 mg/dL (ref 8.4–10.5)
Chloride: 107 mEq/L (ref 96–112)
Creatinine, Ser: 0.57 mg/dL (ref 0.50–1.35)
GFR calc Af Amer: >90 mL/min (ref >90)
GLUCOSE: 96 mg/dL (ref 70–99)
POTASSIUM: 3.4 meq/L — AB (ref 3.7–5.3)
Sodium: 140 mEq/L (ref 137–147)

## 2014-08-01 LAB — URINE CULTURE
Colony Count: NO GROWTH
Culture: NO GROWTH

## 2014-08-01 LAB — CBC
HCT: 27.6 % — ABNORMAL LOW (ref 39.0–52.0)
HEMOGLOBIN: 9.4 g/dL — AB (ref 13.0–17.0)
MCH: 31.9 pg (ref 26.0–34.0)
MCHC: 34.1 g/dL (ref 30.0–36.0)
MCV: 93.6 fL (ref 78.0–100.0)
Platelets: 514 10*3/uL — ABNORMAL HIGH (ref 150–400)
RBC: 2.95 MIL/uL — AB (ref 4.22–5.81)
RDW: 15 % (ref 11.5–15.5)
WBC: 8.6 10*3/uL (ref 4.0–10.5)

## 2014-08-01 LAB — GLUCOSE, CAPILLARY
GLUCOSE-CAPILLARY: 155 mg/dL — AB (ref 70–99)
Glucose-Capillary: 96 mg/dL (ref 70–99)

## 2014-08-01 MED ORDER — POTASSIUM CHLORIDE CRYS ER 20 MEQ PO TBCR
40.0000 meq | EXTENDED_RELEASE_TABLET | Freq: Once | ORAL | Status: AC
  Administered 2014-08-01: 40 meq via ORAL
  Filled 2014-08-01: qty 2

## 2014-08-01 MED ORDER — CEFUROXIME AXETIL 500 MG PO TABS: 500.0000 mg | ORAL_TABLET | Freq: Two times a day (BID) | ORAL | Status: AC

## 2014-08-01 NOTE — Progress Notes (Signed)
[facility] Pt. Discharged by ambulance in good condition.

## 2014-08-01 NOTE — Clinical Social Work Psychosocial (Signed)
Clinical Social Work Department BRIEF PSYCHOSOCIAL ASSESSMENT 08/01/2014  Patient:  Tony Johnson, Tony Johnson     Account Number:  [id number]     Admit date:  07/29/2014  Clinical Social Worker:  Delrae Sawyers  Date/Time:  08/01/2014 12:52 PM  Referred by:  Physician  Date Referred:  08/01/2014 Referred for  SNF Placement   Other Referral:   none.   Interview type:  Patient Other interview type:   none.    PSYCHOSOCIAL DATA Living Status:  FACILITY Admitted from facility:  [facility] Level of care:  [place] Primary support name:  Lurlean Nanny Primary support relationship to patient:  CHILD, ADULT Degree of support available:   Adequate support system.    CURRENT CONCERNS Current Concerns  Post-Acute Placement   Other Concerns:   none.    SOCIAL WORK ASSESSMENT / PLAN CSW received referral stating pt is from [facility] and will be returning once medically stable for discharge. CSW met with pt at bedside to discuss discharge disposition. Pt stated he is agreeable to return to [facility] at time of discharge, and requested CSW not call pt's daughter at time of discharge. CSW to continue to follow and assist with discharge planning needs.   Assessment/plan status:  Psychosocial Support/Ongoing Assessment of Needs Other assessment/ plan:   none.   Information/referral to community resources:   Pt to return to [facility].    PATIENT'S/FAMILY'S RESPONSE TO PLAN OF CARE: Pt understanding and agreeable to CSW plan of care. Pt expressed no further questions or concerns at this time.       Lubertha Sayres, [place] ([phone]) Licensed Clinical Social Worker Orthopedics [phone]) and Surgical [phone])

## 2014-08-01 NOTE — Discharge Instructions (Signed)
Follow with Primary MD or SNF MD in 7 days   Get CBC, CMP  checked  by Primary MD next visit.    Activity: As tolerated with Full fall precautions use walker/cane & assistance as needed   Disposition SNF   Diet: Dysphagia 3 Heart Healthy Low Carb with feeding assistance and aspiration precautions as needed.  For Heart failure patients - Check your Weight same time everyday, if you gain over 2 pounds, or you develop in leg swelling, experience more shortness of breath or chest pain, call your Primary MD immediately. Follow Cardiac Low Salt Diet and 1.8 lit/day fluid restriction.   On your next visit with your primary care physician please Get Medicines reviewed and adjusted.   Please request your Prim.MD to go over all Hospital Tests and Procedure/Radiological results at the follow up, please get all Hospital records sent to your Prim MD by signing hospital release before you go home.   If you experience worsening of your admission symptoms, develop shortness of breath, life threatening emergency, suicidal or homicidal thoughts you must seek medical attention immediately by calling 911 or calling your MD immediately  if symptoms less severe.  You Must read complete instructions/literature along with all the possible adverse reactions/side effects for all the Medicines you take and that have been prescribed to you. Take any new Medicines after you have completely understood and accpet all the possible adverse reactions/side effects.   Do not drive, operating heavy machinery, perform activities at heights, swimming or participation in water activities or provide baby sitting services if your were admitted for syncope or siezures until you have seen by Primary MD or a Neurologist and advised to do so again.  Do not drive when taking Pain medications.    Do not take more than prescribed Pain, Sleep and Anxiety Medications  Special Instructions: If you have smoked or chewed Tobacco  in the  last 2 yrs please stop smoking, stop any regular Alcohol  and or any Recreational drug use.  Wear Seat belts while driving.   Please note  You were cared for by a hospitalist during your hospital stay. If you have any questions about your discharge medications or the care you received while you were in the hospital after you are discharged, you can call the unit and asked to speak with the hospitalist on call if the hospitalist that took care of you is not available. Once you are discharged, your primary care physician will handle any further medical issues. Please note that NO REFILLS for any discharge medications will be authorized once you are discharged, as it is imperative that you return to your primary care physician (or establish a relationship with a primary care physician if you do not have one) for your aftercare needs so that they can reassess your need for medications and monitor your lab values.

## 2014-08-01 NOTE — Clinical Social Work Note (Signed)
[facility]. Pt updated at bedside. Pt declined CSW to contact family regarding discharge.  [facility]: [phone] Transportation: EMS ([address])  Marcelline Deistmily Quintrell Baze, [place] [phone]) Licensed Clinical Social Worker Orthopedics [phone]([id number]) and Surgical [phone])

## 2014-08-01 NOTE — Discharge Summary (Signed)
Tony Johnson, is a 68 y.o. male  DOB 1946/02/17  MRN [id number].  Admission date:  07/29/2014  Admitting Physician  Eduard Clos, MD  Discharge Date:  08/01/2014   Primary MD  No primary provider on file.  Recommendations for primary care physician for things to follow:   Repeat CBC, BMP in 3 days. Needs close followup with speech therapy. Dysphagia 3 diet with feeding assistance and aspiration precautions   Admission Diagnosis  Confusion [R41.0] UTI (lower urinary tract infection) [N39.0] Acute encephalopathy [G93.40] Essential hypertension [I10] Sepsis, due to unspecified organism [A41.9]   Discharge Diagnosis  Confusion [R41.0] UTI (lower urinary tract infection) [N39.0] Acute encephalopathy [G93.40] Essential hypertension [I10] Sepsis, due to unspecified organism [A41.9]    Principal Problem:   Acute encephalopathy Active Problems:   Essential hypertension   Hemiplegia, late effect of cerebrovascular disease   UTI (lower urinary tract infection)   Sepsis      Past Medical History  Diagnosis Date  . Diabetes mellitus   . Hypertension   . CVA (cerebral infarction)     hx of  . Stroke     Past Surgical History  Procedure Laterality Date  . Ganglion cyst excision      s/p removal - wrist in 1965- 07/12/04       History of present illness and  Hospital Course:     Kindly see H&P for history of present illness and admission details, please review complete Labs, Consult reports and Test reports for all details in brief  HPI  - Tony Johnson is a 68 y.o. male with history of recent stroke was brought to the ER because of dysarthria and confusion with fever. On-call neurologist was consulted and patient had MRI of the brain which did not show any acute stroke. Patient was found to be febrile and  UA were showing features consistent with UTI     Hospital Course   1. Acute encephalopathy due to UTI. Responded well to empiric IV Rocephin, change to oral Ceftin for 3 more days. Mentation close to baseline. Discharge home.   2. Sepsis due to UTI. Sepsis resolved with IV fluids and IV Rocephin, culture negative. Change to Ceftin for 3 more days orally.    3. Previous stroke with left-sided hemiparesis possibly mild dysphagia and underlying dysarthria. Seen by speech. On dysphagia 3 diet with feeding assistance and aspiration precautions which will be continued upon discharge to nursing home, continue Aggrenox and statin for secondary prevention for stroke.    4. Dyslipidemia. Continued on statin.    5. Essential hypertension. Blood pressure stable resume previous home regimen.   6.Low potassium. Replaced. Recheck BMP in 3 days.   7. Anemia of chronic disease. Stable followup by PCP outpatient.   Discharge Condition: stable   Follow UP  Follow-up Information   Follow up with Primary care physician or SNF M.D.. Schedule an appointment as soon as possible for a visit in 1 week.        Discharge Instructions  and  Discharge Medications         Discharge Instructions   Discharge instructions    Complete by:  As directed   Follow with Primary MD or SNF MD in 7 days   Get CBC, CMP  checked  by Primary MD next visit.    Activity: As tolerated with Full fall precautions use walker/cane & assistance as needed   Disposition SNF   Diet: Dysphagia 3 Heart Healthy Low Carb with feeding assistance and aspiration precautions as needed.  For Heart failure patients - Check your Weight same time everyday, if you gain over 2 pounds, or you develop in leg swelling, experience more shortness of breath or chest pain, call your Primary MD immediately. Follow Cardiac Low Salt Diet and 1.8 lit/day fluid restriction.   On your next visit with your primary care physician  please Get Medicines reviewed and adjusted.   Please request your Prim.MD to go over all Hospital Tests and Procedure/Radiological results at the follow up, please get all Hospital records sent to your Prim MD by signing hospital release before you go home.   If you experience worsening of your admission symptoms, develop shortness of breath, life threatening emergency, suicidal or homicidal thoughts you must seek medical attention immediately by calling 911 or calling your MD immediately  if symptoms less severe.  You Must read complete instructions/literature along with all the possible adverse reactions/side effects for all the Medicines you take and that have been prescribed to you. Take any new Medicines after you have completely understood and accpet all the possible adverse reactions/side effects.   Do not drive, operating heavy machinery, perform activities at heights, swimming or participation in water activities or provide baby sitting services if your were admitted for syncope or siezures until you have seen by Primary MD or a Neurologist and advised to do so again.  Do not drive when taking Pain medications.    Do not take more than prescribed Pain, Sleep and Anxiety Medications  Special Instructions: If you have smoked or chewed Tobacco  in the last 2 yrs please stop smoking, stop any regular Alcohol  and or any Recreational drug use.  Wear Seat belts while driving.   Please note  You were cared for by a hospitalist during your hospital stay. If you have any questions about your discharge medications or the care you received while you were in the hospital after you are discharged, you can call the unit and asked to speak with the hospitalist on call if the hospitalist that took care of you is not available. Once you are discharged, your primary care physician will handle any further medical issues. Please note that NO REFILLS for any discharge medications will be authorized once  you are discharged, as it is imperative that you return to your primary care physician (or establish a relationship with a primary care physician if you do not have one) for your aftercare needs so that they can reassess your need for medications and monitor your lab values.     Increase activity slowly    Complete by:  As directed             Medication List         cefUROXime 500 MG tablet  Commonly known as:  CEFTIN  Take 1 tablet (500 mg total) by mouth 2 (two) times daily with a meal. 3 more days     cloNIDine 0.1 MG tablet  Commonly known as:  CATAPRES  Take 0.1 mg by mouth every 12 (twelve) hours as needed (essential hypertension.).     dipyridamole-aspirin 200-25 MG per 12 hr capsule  Commonly known as:  AGGRENOX  Take 1 capsule by mouth 2 (two) times daily.     ENSURE PLUS Liqd  Take 237 mLs by mouth 3 (three) times daily.     metFORMIN 1000 MG tablet  Commonly known as:  GLUCOPHAGE  Take 1,000 mg by mouth 2 (two) times daily with a meal.     nystatin 100000 UNIT/ML suspension  Commonly known as:  MYCOSTATIN  Take 5 mLs by mouth 4 (four) times daily. For thrush. Swish and swallow     polyethylene glycol packet  Commonly known as:  MIRALAX / GLYCOLAX  Take 17 g by mouth daily as needed (constipation.).     pravastatin 40 MG tablet  Commonly known as:  PRAVACHOL  Take 40 mg by mouth every morning.     senna 8.6 MG Tabs tablet  Commonly known as:  SENOKOT  Take 1 tablet by mouth daily as needed for mild constipation.          Diet and Activity recommendation: See Discharge Instructions above   Consults obtained - None   Major procedures and Radiology Reports - PLEASE review detailed and final reports for all details, in brief -      Dg Chest 2 View  07/29/2014   CLINICAL DATA:  Slurred speech, left-sided weakness  EXAM: CHEST  2 VIEW  COMPARISON:  06/05/2014  FINDINGS: The heart size and mediastinal contours are within normal limits. Both lungs  are clear. The visualized skeletal structures are unremarkable.  IMPRESSION: No active cardiopulmonary disease.   Electronically Signed   By: Elige Ko   On: 07/29/2014 20:46   Ct Head (brain) Wo Contrast  07/29/2014   CLINICAL DATA:  68 year old male code stroke, increased slurred speech and left side defects. Initial encounter.  EXAM: CT HEAD WITHOUT CONTRAST  TECHNIQUE: Contiguous axial images were obtained from the base of the skull through the vertex without intravenous contrast.  COMPARISON:  Head CT 06/05/2014 and earlier.  FINDINGS: No ventriculomegaly. No midline shift, mass effect, or evidence of intracranial mass lesion. No acute intracranial hemorrhage identified.  Multifocal chronic bilateral cortical infarcts plus lacunar infarcts in the right deep gray matter nuclei and brainstem. No evidence of cortically based acute infarction identified. Calcified atherosclerosis at the skull base. No suspicious intracranial vascular hyperdensity.  No acute osseous abnormality identified. Stable paranasal sinuses and mastoids. No acute orbit or scalp soft tissue findings.  IMPRESSION: Stable non contrast CT appearance of the brain. Chronic small and medium-sized vessel ischemia.  Study discussed by telephone with Dr. Ritta Slot on 07/29/2014 at 1815 hrs.   Electronically Signed   By: Augusto Gamble M.D.   On: 07/29/2014 18:18   Mr Brain Wo Contrast  07/29/2014   CLINICAL DATA:  New onset weakness and confusion. Personal history of a brainstem stroke in July. Slurred speech beginning several hr ago.  EXAM: MRI HEAD WITHOUT CONTRAST  TECHNIQUE: Multiplanar, multiecho pulse sequences of the brain and surrounding structures were obtained without intravenous contrast.  COMPARISON:  Head CT same day.  MRI 05/08/2014.  FINDINGS: Diffusion imaging does not show any acute or subacute infarction. There are extensive old pontine infarctions. No focal cerebellar insult. There is old infarction in the right basal  ganglia and the right thalamus. There chronic small-vessel ischemic changes affecting the deep white matter. There is an  old cortical and subcortical infarction in the left frontal lobe and right parietal lobe. No evidence of neoplastic mass lesion, acute hemorrhage, hydrocephalus or extra-axial collection. There is some residual hemosiderin deposition of the sided the old infarctions. No pituitary mass. No hydrocephalus. No extra-axial fluid collection. No skull or skullbase lesion.  IMPRESSION: No acute or subacute insult. Extensive chronic ischemic changes throughout the brain as outlined above.   Electronically Signed   By: Paulina FusiMark  Shogry M.D.   On: 07/29/2014 20:07    Micro Results      Recent Results (from the past 240 hour(s))  CULTURE, BLOOD (ROUTINE X 2)     Status: None   Collection Time    07/29/14  8:55 PM      Result Value Ref Range Status   Specimen Description BLOOD RIGHT ANTECUBITAL   Final   Special Requests     Final   Value: BOTTLES DRAWN AEROBIC AND ANAEROBIC 5CC BLUE 4CC RED   Culture  Setup Time     Final   Value: 07/30/2014 02:11     Performed at [facility]   Culture     Final   Value:        BLOOD CULTURE RECEIVED NO GROWTH TO DATE CULTURE WILL BE HELD FOR 5 DAYS BEFORE ISSUING A FINAL NEGATIVE REPORT     Performed at [facility]   Report Status PENDING   Incomplete  CULTURE, BLOOD (ROUTINE X 2)     Status: None   Collection Time    07/29/14  9:03 PM      Result Value Ref Range Status   Specimen Description BLOOD LEFT FOREARM   Final   Special Requests BOTTLES DRAWN AEROBIC AND ANAEROBIC 5CC EA   Final   Culture  Setup Time     Final   Value: 07/30/2014 02:11     Performed at [facility]   Culture     Final   Value:        BLOOD CULTURE RECEIVED NO GROWTH TO DATE CULTURE WILL BE HELD FOR 5 DAYS BEFORE ISSUING A FINAL NEGATIVE REPORT     Performed at [facility]   Report Status PENDING   Incomplete       Today    Subjective:   Tony Johnson today has no headache,no chest abdominal pain,no new weakness tingling or numbness, feels much better.  Objective:   Blood pressure 156/61, pulse 60, temperature 99 F (37.2 C), temperature source Oral, resp. rate 16, height 6\' 2"  (1.88 m), weight 81.149 kg (178 lb 14.4 oz), SpO2 97.00%.   Intake/Output Summary (Last 24 hours) at 08/01/14 1043 Last data filed at 08/01/14 0900  Gross per 24 hour  Intake   1130 ml  Output   1150 ml  Net    -20 ml    Exam Awake Alert, Oriented x 3, No new F.N deficits, chronic left-sided hemiparesis and some dysarthria due to previous stroke Normal affect Calvin.AT,PERRAL Supple Neck,No JVD, No cervical lymphadenopathy appriciated.  Symmetrical Chest wall movement, Good air movement bilaterally, CTAB RRR,No Gallops,Rubs or new Murmurs, No Parasternal Heave +ve B.Sounds, Abd Soft, Non tender, No organomegaly appriciated, No rebound -guarding or rigidity. No Cyanosis, Clubbing or edema, No new Rash or bruise  Data Review   CBC w Diff: Lab Results  Component Value Date   WBC 8.6 08/01/2014   HGB 9.4* 08/01/2014   HCT 27.6* 08/01/2014   PLT 514* 08/01/2014   LYMPHOPCT 11*  07/30/2014   MONOPCT 11 07/30/2014   EOSPCT 0 07/30/2014   BASOPCT 0 07/30/2014    CMP: Lab Results  Component Value Date   NA 140 08/01/2014   K 3.4* 08/01/2014   CL 107 08/01/2014   CO2 21 08/01/2014   BUN 7 08/01/2014   CREATININE 0.57 08/01/2014   PROT 6.6 07/30/2014   ALBUMIN 2.8* 07/30/2014   BILITOT 0.3 07/30/2014   ALKPHOS 62 07/30/2014   AST 11 07/30/2014   ALT 10 07/30/2014  .   Total Time in preparing paper work, data evaluation and todays exam - 35 minutes  Leroy Sea M.D on 08/01/2014 at 10:43 AM  [facility] Office  [phone]

## 2014-08-05 LAB — CULTURE, BLOOD (ROUTINE X 2)
Culture: NO GROWTH
Culture: NO GROWTH

## 2015-03-14 ENCOUNTER — Emergency Department (HOSPITAL_COMMUNITY): Payer: Medicare Other

## 2015-03-14 ENCOUNTER — Encounter (HOSPITAL_COMMUNITY): Payer: Self-pay

## 2015-03-14 ENCOUNTER — Emergency Department (HOSPITAL_COMMUNITY)
Admission: EM | Admit: 2015-03-14 | Discharge: 2015-03-14 | Disposition: A | Discharge status: Home / Self Care | Payer: Medicare Other | Attending: Emergency Medicine | Admitting: Emergency Medicine

## 2015-03-14 DIAGNOSIS — I10 Essential (primary) hypertension: Secondary | ICD-10-CM | POA: Insufficient documentation

## 2015-03-14 DIAGNOSIS — S0990XA Unspecified injury of head, initial encounter: Secondary | ICD-10-CM

## 2015-03-14 DIAGNOSIS — Z79899 Other long term (current) drug therapy: Secondary | ICD-10-CM | POA: Insufficient documentation

## 2015-03-14 DIAGNOSIS — Y998 Other external cause status: Secondary | ICD-10-CM | POA: Insufficient documentation

## 2015-03-14 DIAGNOSIS — W19XXXA Unspecified fall, initial encounter: Secondary | ICD-10-CM

## 2015-03-14 DIAGNOSIS — W06XXXA Fall from bed, initial encounter: Secondary | ICD-10-CM | POA: Insufficient documentation

## 2015-03-14 DIAGNOSIS — Z87448 Personal history of other diseases of urinary system: Secondary | ICD-10-CM | POA: Insufficient documentation

## 2015-03-14 DIAGNOSIS — E119 Type 2 diabetes mellitus without complications: Secondary | ICD-10-CM | POA: Insufficient documentation

## 2015-03-14 DIAGNOSIS — Y9389 Activity, other specified: Secondary | ICD-10-CM | POA: Insufficient documentation

## 2015-03-14 DIAGNOSIS — Z8669 Personal history of other diseases of the nervous system and sense organs: Secondary | ICD-10-CM | POA: Insufficient documentation

## 2015-03-14 DIAGNOSIS — Z8673 Personal history of transient ischemic attack (TIA), and cerebral infarction without residual deficits: Secondary | ICD-10-CM | POA: Insufficient documentation

## 2015-03-14 DIAGNOSIS — Z23 Encounter for immunization: Secondary | ICD-10-CM | POA: Insufficient documentation

## 2015-03-14 DIAGNOSIS — Y9289 Other specified places as the place of occurrence of the external cause: Secondary | ICD-10-CM | POA: Insufficient documentation

## 2015-03-14 HISTORY — DX: Dysphagia, unspecified: R13.10

## 2015-03-14 HISTORY — DX: Hemiplegia, unspecified affecting left nondominant side: G81.94

## 2015-03-14 HISTORY — DX: Hemiplegia, unspecified affecting unspecified side: G81.90

## 2015-03-14 HISTORY — DX: Disorientation, unspecified: R41.0

## 2015-03-14 HISTORY — DX: Adjustment disorder, unspecified: F43.20

## 2015-03-14 HISTORY — DX: Disorder of kidney and ureter, unspecified: N28.9

## 2015-03-14 MED ORDER — TETANUS-DIPHTH-ACELL PERTUSSIS 5-2.5-18.5 LF-MCG/0.5 IM SUSP
0.5000 mL | Freq: Once | INTRAMUSCULAR | Status: AC
  Administered 2015-03-14: 0.5 mL via INTRAMUSCULAR
  Filled 2015-03-14: qty 0.5

## 2015-03-14 NOTE — Discharge Instructions (Signed)
Head Injury  You have received a head injury. It does not appear serious at this time. Headaches and vomiting are common following head injury. It should be easy to awaken from sleeping. Sometimes it is necessary for you to stay in the emergency department for a while for observation. Sometimes admission to the hospital may be needed. After injuries such as yours, most problems occur within the first 24 hours, but side effects may occur up to 7-10 days after the injury. It is important for you to carefully monitor your condition and contact your health care provider or seek immediate medical care if there is a change in your condition.  WHAT ARE THE TYPES OF HEAD INJURIES?  Head injuries can be as minor as a bump. Some head injuries can be more severe. More severe head injuries include:   A jarring injury to the brain (concussion).   A bruise of the brain (contusion). This mean there is bleeding in the brain that can cause swelling.   A cracked skull (skull fracture).   Bleeding in the brain that collects, clots, and forms a bump (hematoma).  WHAT CAUSES A HEAD INJURY?  A serious head injury is most likely to happen to someone who is in a car wreck and is not wearing a seat belt. Other causes of major head injuries include bicycle or motorcycle accidents, sports injuries, and falls.  HOW ARE HEAD INJURIES DIAGNOSED?  A complete history of the event leading to the injury and your current symptoms will be helpful in diagnosing head injuries. Many times, pictures of the brain, such as CT or MRI are needed to see the extent of the injury. Often, an overnight hospital stay is necessary for observation.   WHEN SHOULD I SEEK IMMEDIATE MEDICAL CARE?   You should get help right away if:   You have confusion or drowsiness.   You feel sick to your stomach (nauseous) or have continued, forceful vomiting.   You have dizziness or unsteadiness that is getting worse.   You have severe, continued headaches not relieved by  medicine. Only take over-the-counter or prescription medicines for pain, fever, or discomfort as directed by your health care provider.   You do not have normal function of the arms or legs or are unable to walk.   You notice changes in the black spots in the center of the colored part of your eye (pupil).   You have a clear or bloody fluid coming from your nose or ears.   You have a loss of vision.  During the next 24 hours after the injury, you must stay with someone who can watch you for the warning signs. This person should contact local emergency services (911 in the [place] if you have seizures, you become unconscious, or you are unable to wake up.  HOW CAN I PREVENT A HEAD INJURY IN THE FUTURE?  The most important factor for preventing major head injuries is avoiding motor vehicle accidents. To minimize the potential for damage to your head, it is crucial to wear seat belts while riding in motor vehicles. Wearing helmets while bike riding and playing collision sports (like football) is also helpful. Also, avoiding dangerous activities around the house will further help reduce your risk of head injury.   WHEN CAN I RETURN TO NORMAL ACTIVITIES AND ATHLETICS?  You should be reevaluated by your health care provider before returning to these activities. If you have any of the following symptoms, you should not return to activities   or contact sports until 1 week after the symptoms have stopped:   Persistent headache.   Dizziness or vertigo.   Poor attention and concentration.   Confusion.   Memory problems.   Nausea or vomiting.   Fatigue or tire easily.   Irritability.   Intolerant of bright lights or loud noises.   Anxiety or depression.   Disturbed sleep.  MAKE SURE YOU:    Understand these instructions.   Will watch your condition.   Will get help right away if you are not doing well or get worse.  Document Released: 09/28/2005 Document Revised: 10/03/2013 Document Reviewed:  06/05/2013  Exit[employer] Patient Information 2015 [employer]. This information is not intended to replace advice given to you by your health care provider. Make sure you discuss any questions you have with your health care provider.    Fall Prevention and Home Safety  Falls cause injuries and can affect all age groups. It is possible to use preventive measures to significantly decrease the likelihood of falls. There are many simple measures which can make your home safer and prevent falls.  OUTDOORS   Repair cracks and edges of walkways and driveways.   Remove high doorway thresholds.   Trim shrubbery on the main path into your home.   Have good outside lighting.   Clear walkways of tools, rocks, debris, and clutter.   Check that handrails are not broken and are securely fastened. Both sides of steps should have handrails.   Have leaves, snow, and ice cleared regularly.   Use sand or salt on walkways during winter months.   In the garage, clean up grease or oil spills.  BATHROOM   Install night lights.   Install grab bars by the toilet and in the tub and shower.   Use non-skid mats or decals in the tub or shower.   Place a plastic non-slip stool in the shower to sit on, if needed.   Keep floors dry and clean up all water on the floor immediately.   Remove soap buildup in the tub or shower on a regular basis.   Secure bath mats with non-slip, double-sided rug tape.   Remove throw rugs and tripping hazards from the floors.  BEDROOMS   Install night lights.   Make sure a bedside light is easy to reach.   Do not use oversized bedding.   Keep a telephone by your bedside.   Have a firm chair with side arms to use for getting dressed.   Remove throw rugs and tripping hazards from the floor.  KITCHEN   Keep handles on pots and pans turned toward the center of the stove. Use back burners when possible.   Clean up spills quickly and allow time for drying.   Avoid walking on wet floors.   Avoid hot  utensils and knives.   Position shelves so they are not too high or low.   Place commonly used objects within easy reach.   If necessary, use a sturdy step stool with a grab bar when reaching.   Keep electrical cables out of the way.   Do not use floor polish or wax that makes floors slippery. If you must use wax, use non-skid floor wax.   Remove throw rugs and tripping hazards from the floor.  STAIRWAYS   Never leave objects on stairs.   Place handrails on both sides of stairways and use them. Fix any loose handrails. Make sure handrails on both sides of the stairways   are as long as the stairs.   Check carpeting to make sure it is firmly attached along stairs. Make repairs to worn or loose carpet promptly.   Avoid placing throw rugs at the top or bottom of stairways, or properly secure the rug with carpet tape to prevent slippage. Get rid of throw rugs, if possible.   Have an electrician put in a light switch at the top and bottom of the stairs.  OTHER FALL PREVENTION TIPS   Wear low-heel or rubber-soled shoes that are supportive and fit well. Wear closed toe shoes.   When using a stepladder, make sure it is fully opened and both spreaders are firmly locked. Do not climb a closed stepladder.   Add color or contrast paint or tape to grab bars and handrails in your home. Place contrasting color strips on first and last steps.   Learn and use mobility aids as needed. Install an electrical emergency response system.   Turn on lights to avoid dark areas. Replace light bulbs that burn out immediately. Get light switches that glow.   Arrange furniture to create clear pathways. Keep furniture in the same place.   Firmly attach carpet with non-skid or double-sided tape.   Eliminate uneven floor surfaces.   Select a carpet pattern that does not visually hide the edge of steps.   Be aware of all pets.  OTHER HOME SAFETY TIPS   Set the water temperature for 120 F (48.8 C).   Keep emergency numbers on  or near the telephone.   Keep smoke detectors on every level of the home and near sleeping areas.  Document Released: 09/18/2002 Document Revised: 03/29/2012 Document Reviewed: 12/18/2011  [employer] Patient Information 2015 [employer]. This information is not intended to replace advice given to you by your health care provider. Make sure you discuss any questions you have with your health care provider.

## 2015-03-14 NOTE — ED Provider Notes (Signed)
TIME SEEN: 1:00 AM  CHIEF COMPLAINT: Fall out of bed  HPI: Pt is a 69 y.o. male with history of hypertension, diabetes, prior CVA with left-sided hemiplegia who presents to the emergency department after he fell out of bed trying to reach his call button at his nursing home today. He did hit his head but denies loss of consciousness. He denies being on anticoagulation but it appears that he is on Aggrenox. He denies any chest pain or shortness of breath. Denies headache. No neck or back pain. Denies abdominal pain. No numbness, tingling or focal weakness that is new.  ROS: See HPI Constitutional: no fever  Eyes: no drainage  ENT: no runny nose   Cardiovascular:  no chest pain  Resp: no SOB  GI: no vomiting GU: no dysuria Integumentary: no rash  Allergy: no hives  Musculoskeletal: no leg swelling  Neurological: no slurred speech ROS otherwise negative  PAST MEDICAL HISTORY/PAST SURGICAL HISTORY:  Past Medical History  Diagnosis Date  . Diabetes mellitus   . Hypertension   . CVA (cerebral infarction)     hx of  . Stroke   . Dysphagia   . Hemiplegia affecting left nondominant side   . Adjustment disorder   . Hemiparesis   . Renal disorder   . Disorientation     MEDICATIONS:  Prior to Admission medications   Medication Sig Start Date End Date Taking? Authorizing Provider  acetaminophen (TYLENOL) 500 MG tablet Take 500 mg by mouth every 4 (four) hours as needed for mild pain or fever.   Yes Historical Provider, MD  cholecalciferol (VITAMIN D) 1000 UNITS tablet Take 5,000 Units by mouth daily.   Yes Historical Provider, MD  cloNIDine (CATAPRES) 0.1 MG tablet Take 0.1 mg by mouth every 12 (twelve) hours as needed (essential hypertension.).   Yes Historical Provider, MD  dipyridamole-aspirin (AGGRENOX) 200-25 MG per 12 hr capsule Take 1 capsule by mouth 2 (two) times daily. 05/11/14  Yes Maryann Mikhail, DO  loperamide (IMODIUM) 2 MG capsule Take 2 mg by mouth as needed for diarrhea  or loose stools.   Yes Historical Provider, MD  metFORMIN (GLUCOPHAGE) 1000 MG tablet Take 1,000 mg by mouth 2 (two) times daily with a meal.   Yes Historical Provider, MD  Multiple Vitamin (MULTIVITAMIN WITH MINERALS) TABS tablet Take 1 tablet by mouth daily.   Yes Historical Provider, MD  polyethylene glycol (MIRALAX / GLYCOLAX) packet Take 17 g by mouth daily as needed for moderate constipation.    Yes Historical Provider, MD  pravastatin (PRAVACHOL) 40 MG tablet Take 40 mg by mouth every morning.    Yes Historical Provider, MD  senna (SENOKOT) 8.6 MG TABS tablet Take 1 tablet by mouth daily as needed for mild constipation.   Yes Historical Provider, MD  traMADol (ULTRAM) 50 MG tablet Take 1 tablet by mouth every 6 (six) hours as needed for moderate pain.  02/20/15  Yes Historical Provider, MD  cefUROXime (CEFTIN) 500 MG tablet Take 1 tablet (500 mg total) by mouth 2 (two) times daily with a meal. 3 more days Patient not taking: Reported on 03/14/2015 08/01/14   Leroy Sea, MD    ALLERGIES:  No Known Allergies  SOCIAL HISTORY:  History  Substance Use Topics  . Smoking status: Former Games developer  . Smokeless tobacco: Not on file  . Alcohol Use: No    FAMILY HISTORY: Family History  Problem Relation Age of Onset  . Cancer Mother     breast cancer  .  Diabetes Father     EXAM: BP 148/67 mmHg  Pulse 60  Temp(Src) 98 F (36.7 C) (Oral)  Resp 18  SpO2 98% CONSTITUTIONAL: Alert and oriented and responds appropriately to questions. Well-appearing; well-nourished; GCS 15 HEAD: Normocephalic; left periorbital ecchymosis and swelling to the upper lid with a 3 cm very superficial hemostatic laceration to the eyebrow on the left EYES: Conjunctivae clear, PERRL, EOMI ENT: normal nose; no rhinorrhea; moist mucous membranes; pharynx without lesions noted; no dental injury; no septal hematoma, no bony tenderness over the face NECK: Supple, no meningismus, no LAD; no midline spinal tenderness,  step-off or deformity CARD: RRR; S1 and S2 appreciated; no murmurs, no clicks, no rubs, no gallops RESP: Normal chest excursion without splinting or tachypnea; breath sounds clear and equal bilaterally; no wheezes, no rhonchi, no rales; no hypoxia or respiratory distress CHEST:  chest wall stable, no crepitus or ecchymosis or deformity, nontender to palpation ABD/GI: Normal bowel sounds; non-distended; soft, non-tender, no rebound, no guarding PELVIS:  stable, nontender to palpation BACK:  The back appears normal and is non-tender to palpation, there is no CVA tenderness; no midline spinal tenderness, step-off or deformity EXT: Normal ROM in all joints; non-tender to palpation; no edema; normal capillary refill; no cyanosis, no bony tenderness or bony deformity of patient's extremities, no joint effusion, no ecchymosis or lacerations    SKIN: Normal color for age and race; warm NEURO: Left-sided hemiplegia which is chronic, can move the right arm and right leg, mild left-sided facial droop which is also chronic, no dysarthria or aphasia, but reports normal sensation diffusely PSYCH: The patient's mood and manner are appropriate. Grooming and personal hygiene are appropriate.  MEDICAL DECISION MAKING: Patient here with fall out of bed. CT of his head, face and cervical spine show no acute injury other than soft tissue swelling at the superior aspect of the left orbit. He is still at his neurologic baseline. His tetanus has been updated. I feel he is safe to be discharged home to his nursing facility. Discussed head injury return precautions. He declines pain medication. He verbalized understanding and is comfortable with this plan.       Layla MawKristen N Shondra Capps, DO 03/14/15 0400

## 2015-03-14 NOTE — ED Notes (Signed)
Patient transported to CT 

## 2015-03-14 NOTE — ED Notes (Signed)
Communications notified of patient's need for transport back to facility.   

## 2015-03-14 NOTE — ED Notes (Signed)
[facility] arrived to ED [facility] after he fell out of bed while reaching for remote control.   Hematoma noted above left eye.

## 2016-05-20 IMAGING — CT CT HEAD W/O CM
1 of 2 series · 16 of 30 positions shown, 20 images · non-contrast
Comparison: 07/12/2009

CLINICAL DATA: Slurred speech

EXAM:
CT HEAD WITHOUT CONTRAST
TECHNIQUE: Contiguous axial images were obtained from the base of the skull
through the vertex without intravenous contrast.

[Series 3: head 2.0 h70h · axial · 0.46mm/px · z∈[-164,-12]mm · 16 of 84 slices shown, 20 images]
[im 4/84  brain]
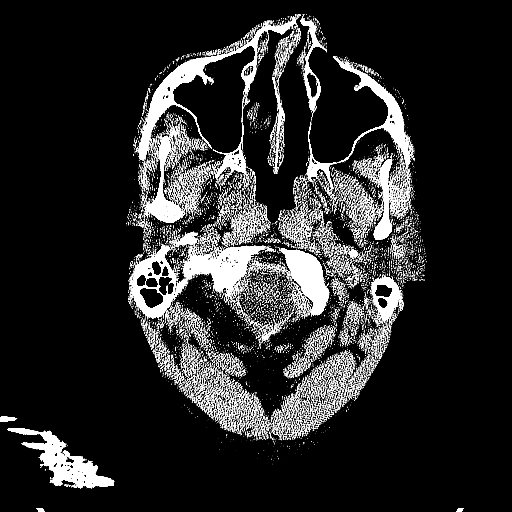
[im 4/84  bone]
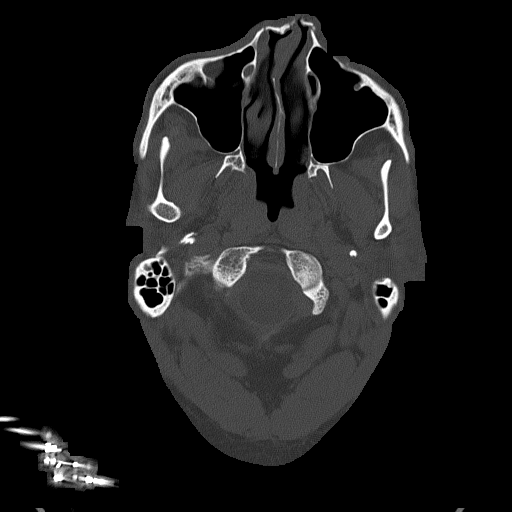
[im 8/84  brain]
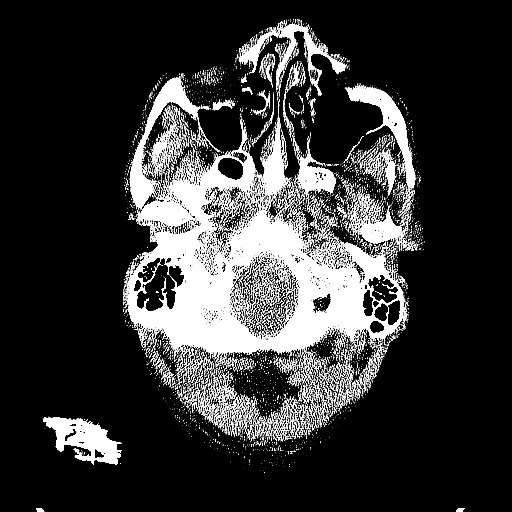
[im 16/84  brain]
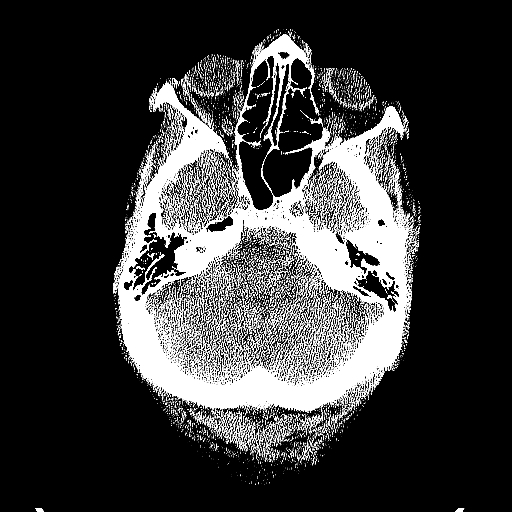
[im 20/84  brain]
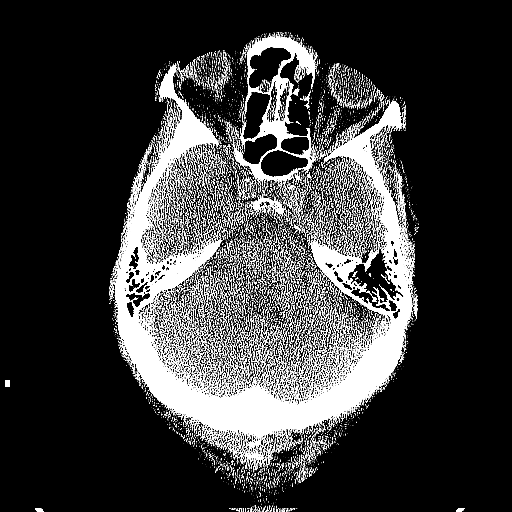
[im 24/84  brain]
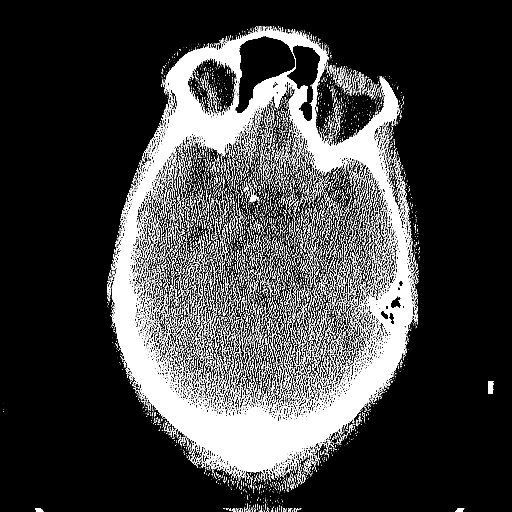
[im 24/84  bone]
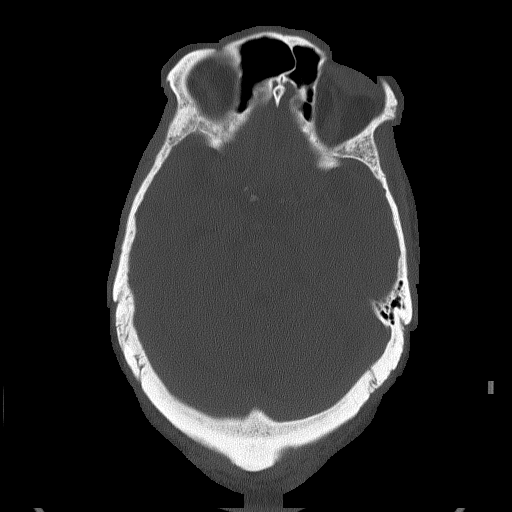
[im 28/84  brain]
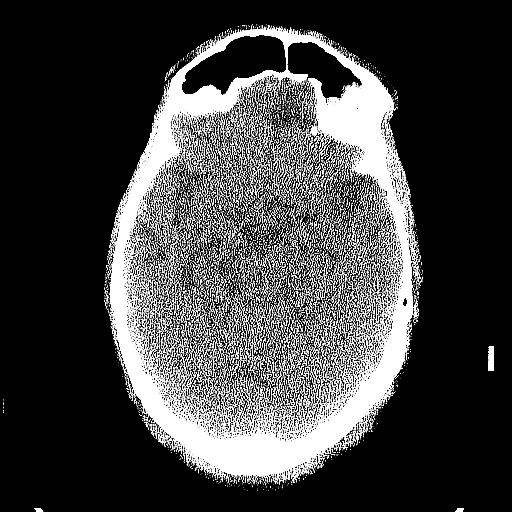
[im 36/84  brain]
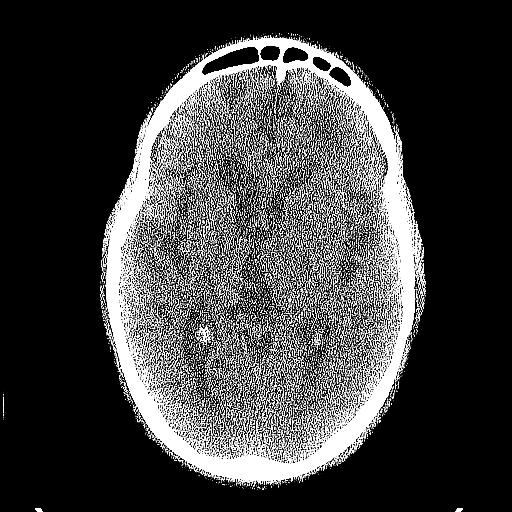
[im 40/84  brain]
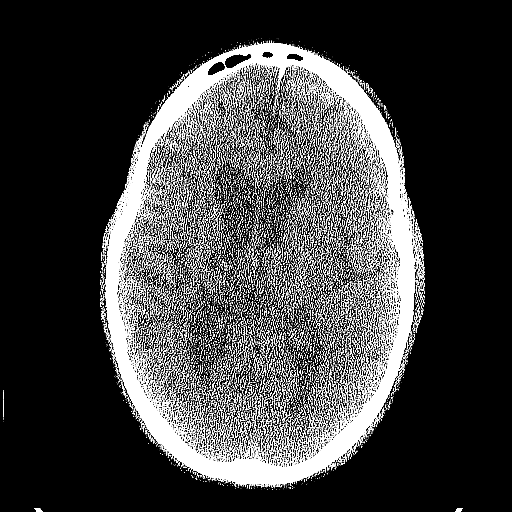
[im 44/84  brain]
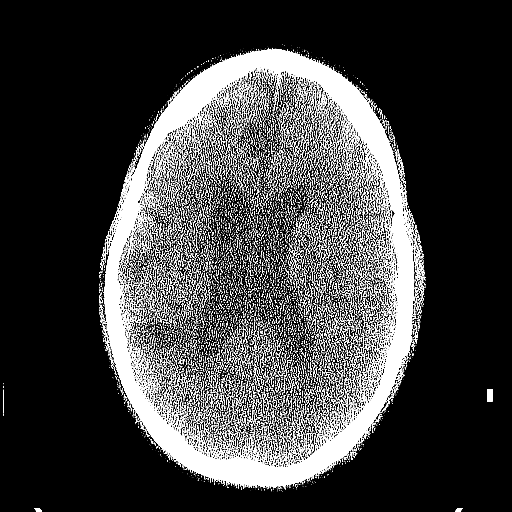
[im 44/84  bone]
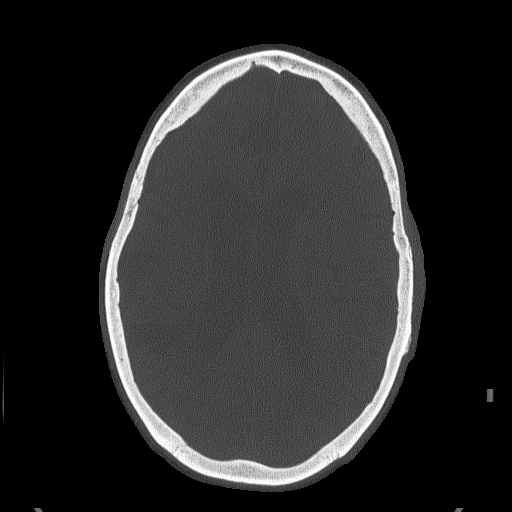
[im 48/84  brain]
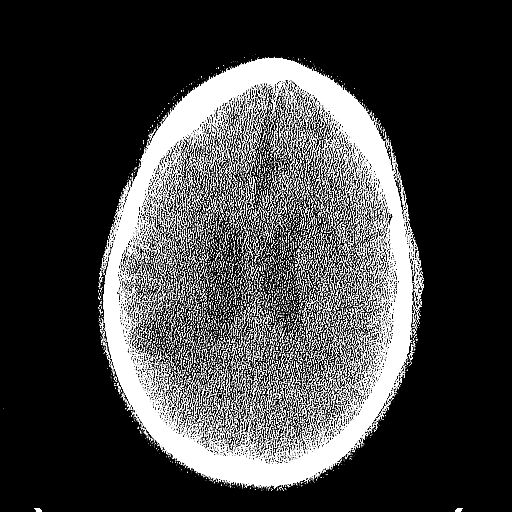
[im 56/84  brain]
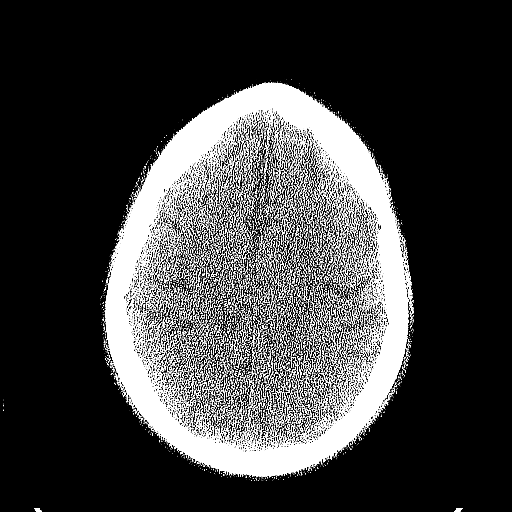
[im 60/84  brain]
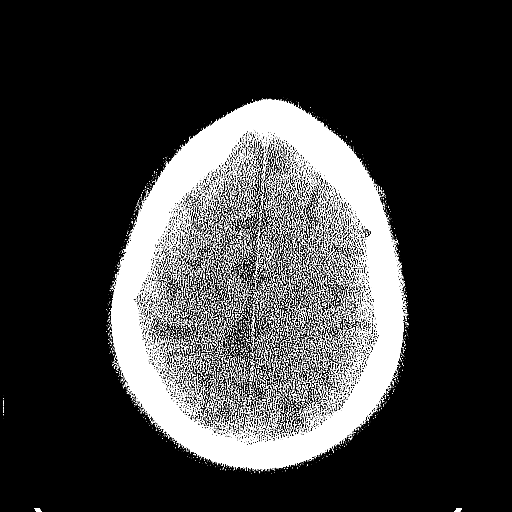
[im 64/84  brain]
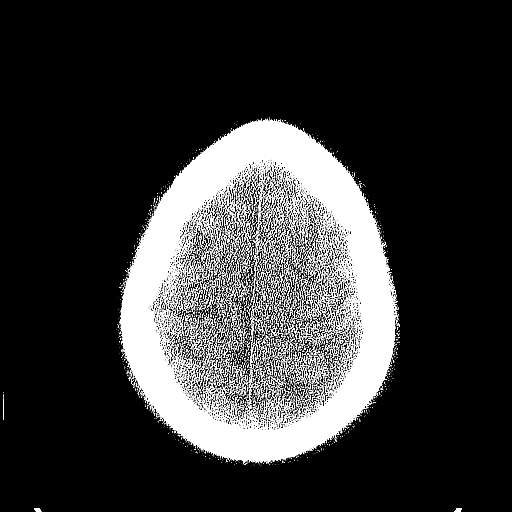
[im 64/84  bone]
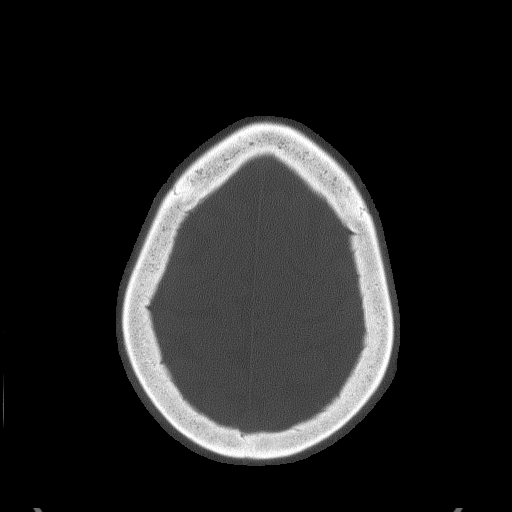
[im 68/84  brain]
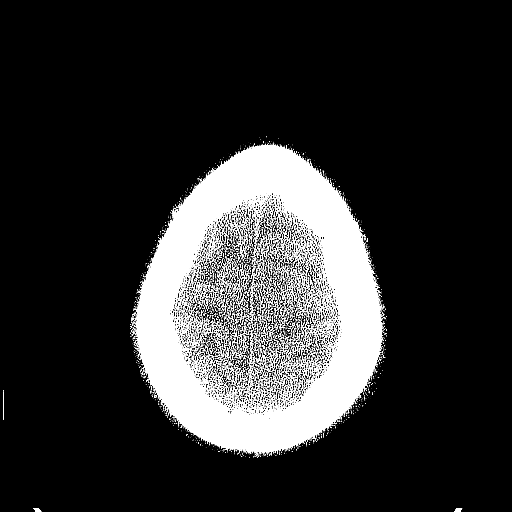
[im 76/84  brain]
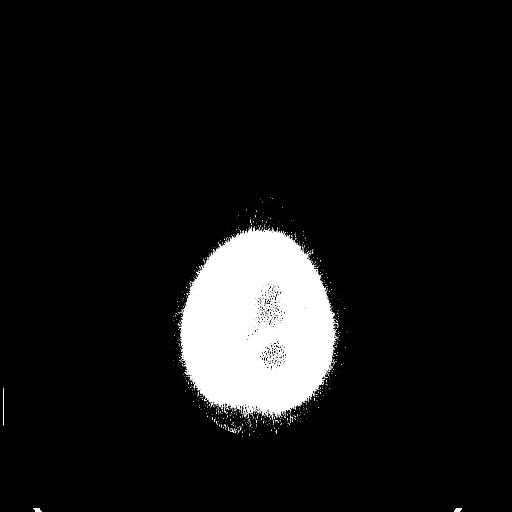
[im 80/84  brain]
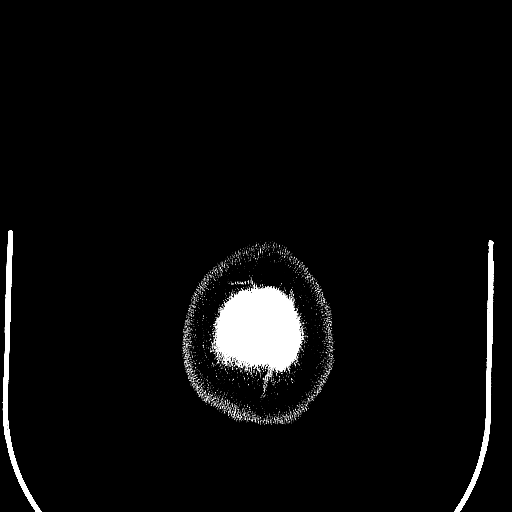

[16 of 30 positions shown; findings below may reference images not displayed]

FINDINGS: No skull fracture is noted. Paranasal sinuses and mastoid air cells
are unremarkable. Stable atrophy and chronic white matter disease.
Stable lacunar infarcts in right parietal lobe and frontal lobe. A
lacunar infarct in right internal capsule again noted. No
intracranial hemorrhage, mass effect or midline shift. No definite
acute cortical infarction. No mass lesion is noted on this
unenhanced scan.
IMPRESSION: No acute intracranial abnormality. Stable atrophy and chronic white
matter disease. Stable lacunar infarcts as described above. No
definite acute cortical infarction.

## 2018-09-05 ENCOUNTER — Emergency Department (HOSPITAL_COMMUNITY)
Admission: EM | Admit: 2018-09-05 | Discharge: 2018-09-06 | Disposition: A | Discharge status: Home / Self Care | Payer: Medicaid Other | Attending: Emergency Medicine | Admitting: Emergency Medicine

## 2018-09-05 ENCOUNTER — Emergency Department (HOSPITAL_COMMUNITY): Payer: Medicaid Other

## 2018-09-05 DIAGNOSIS — Z8719 Personal history of other diseases of the digestive system: Secondary | ICD-10-CM | POA: Insufficient documentation

## 2018-09-05 DIAGNOSIS — R29818 Other symptoms and signs involving the nervous system: Secondary | ICD-10-CM | POA: Insufficient documentation

## 2018-09-05 DIAGNOSIS — Z8673 Personal history of transient ischemic attack (TIA), and cerebral infarction without residual deficits: Secondary | ICD-10-CM | POA: Insufficient documentation

## 2018-09-05 DIAGNOSIS — R29810 Facial weakness: Secondary | ICD-10-CM | POA: Diagnosis present

## 2018-09-05 DIAGNOSIS — I10 Essential (primary) hypertension: Secondary | ICD-10-CM | POA: Diagnosis not present

## 2018-09-05 DIAGNOSIS — E119 Type 2 diabetes mellitus without complications: Secondary | ICD-10-CM | POA: Diagnosis not present

## 2018-09-05 DIAGNOSIS — R2990 Unspecified symptoms and signs involving the nervous system: Secondary | ICD-10-CM

## 2018-09-05 DIAGNOSIS — Z87891 Personal history of nicotine dependence: Secondary | ICD-10-CM | POA: Diagnosis not present

## 2018-09-05 LAB — CBC
HCT: 35.9 % — ABNORMAL LOW (ref 39.0–52.0)
Hemoglobin: 10.5 g/dL — ABNORMAL LOW (ref 13.0–17.0)
MCH: 25.3 pg — ABNORMAL LOW (ref 26.0–34.0)
MCHC: 29.2 g/dL — AB (ref 30.0–36.0)
MCV: 86.5 fL (ref 80.0–100.0)
PLATELETS: 478 10*3/uL — AB (ref 150–400)
RBC: 4.15 MIL/uL — AB (ref 4.22–5.81)
RDW: 22.9 % — ABNORMAL HIGH (ref 11.5–15.5)
WBC: 10.4 10*3/uL (ref 4.0–10.5)
nRBC: 0 % (ref 0.0–0.2)

## 2018-09-05 LAB — DIFFERENTIAL
Abs Immature Granulocytes: 0 10*3/uL (ref 0.00–0.07)
BASOS ABS: 0 10*3/uL (ref 0.0–0.1)
Basophils Relative: 0 %
Eosinophils Absolute: 0.7 10*3/uL — ABNORMAL HIGH (ref 0.0–0.5)
Eosinophils Relative: 7 %
LYMPHS ABS: 1.5 10*3/uL (ref 0.7–4.0)
Lymphocytes Relative: 14 %
MONO ABS: 0.3 10*3/uL (ref 0.1–1.0)
Monocytes Relative: 3 %
NEUTROS PCT: 76 %
Neutro Abs: 7.9 10*3/uL — ABNORMAL HIGH (ref 1.7–7.7)
nRBC: 0 /100 WBC

## 2018-09-05 LAB — COMPREHENSIVE METABOLIC PANEL
ALK PHOS: 75 U/L (ref 38–126)
ALT: 15 U/L (ref 0–44)
AST: 22 U/L (ref 15–41)
Albumin: 3.6 g/dL (ref 3.5–5.0)
Anion gap: 10 (ref 5–15)
BILIRUBIN TOTAL: 0.7 mg/dL (ref 0.3–1.2)
BUN: 8 mg/dL (ref 8–23)
CALCIUM: 9.1 mg/dL (ref 8.9–10.3)
CO2: 20 mmol/L — ABNORMAL LOW (ref 22–32)
Chloride: 109 mmol/L (ref 98–111)
Creatinine, Ser: 0.67 mg/dL (ref 0.61–1.24)
GLUCOSE: 99 mg/dL (ref 70–99)
Potassium: 3.7 mmol/L (ref 3.5–5.1)
Sodium: 139 mmol/L (ref 135–145)
TOTAL PROTEIN: 7.1 g/dL (ref 6.5–8.1)

## 2018-09-05 LAB — I-STAT TROPONIN, ED: Troponin i, poc: 0.02 ng/mL (ref 0.00–0.08)

## 2018-09-05 LAB — APTT: aPTT: 26 seconds (ref 24–36)

## 2018-09-05 LAB — PROTIME-INR
INR: 1.2
Prothrombin Time: 15.1 seconds (ref 11.4–15.2)

## 2018-09-05 LAB — ETHANOL

## 2018-09-05 NOTE — ED Provider Notes (Signed)
Emergency Department Provider Note   I have reviewed the triage vital signs and the nursing notes.   HISTORY  Chief Complaint Facial Droop   HPI Tony Johnson is a 72 y.o. male with PMH of prior CVA with baseline dysphagia and left side weakness presents to the ED with report of acute onset left face weakness. Patient last seen normal yesterday evening. Staff noted new left face weakness today.  Level 5 caveat: Dysarthria and prior CVA symptoms.   Past Medical History:  Diagnosis Date  . Adjustment disorder   . CVA (cerebral infarction)    hx of  . Diabetes mellitus   . Disorientation   . Dysphagia   . Hemiparesis   . Hemiplegia affecting left nondominant side   . Hypertension   . Renal disorder   . Stroke     Patient Active Problem List   Diagnosis Date Noted  . Sepsis (HCC) 07/29/2014  . Acute encephalopathy 07/29/2014  . Diarrhea 06/08/2014  . Hypernatremia 06/06/2014  . Metabolic acidosis 06/06/2014  . Acute renal failure (HCC) 06/05/2014  . Dysphagia, pharyngoesophageal phase 05/10/2014  . Acute CVA (cerebrovascular accident) (HCC) 05/10/2014  . Aphasia 05/07/2014  . UTI (lower urinary tract infection) 05/07/2014  . CIGARETTE SMOKER 09/22/2010  . DIVERTICULOSIS OF COLON 03/24/2010  . Essential hypertension 09/11/2009  . DIABETES MELLITUS, TYPE II 09/11/2009  . 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 HYPERCHOLESTEROLEMIA 12/09/2006  . ERECTILE DYSFUNCTION 12/09/2006    Past Surgical History:  Procedure Laterality Date  . GANGLION CYST EXCISION     s/p removal - wrist in 1965- 07/12/04    Allergies Patient has no known allergies.  Family History  Problem Relation Age of Onset  . Cancer Mother        breast cancer  . Diabetes Father     Social History Social History   Tobacco Use  . Smoking status: Former Smoker  Substance Use Topics  . Alcohol use: No  . Drug use: Not on file    Review of  Systems  Level 5 caveat: Dysarthria from prior CVA.   ____________________________________________   PHYSICAL EXAM:  VITAL SIGNS: Vitals:   09/05/18 2330 09/06/18 0156  BP: (!) 158/87 (!) 184/92  Pulse: 86 87  Resp: (!) 23 18  Temp:  98.9 F (37.2 C)  SpO2: 98% 99%    Constitutional: Alert and oriented. Well appearing and in no acute distress. Eyes: Conjunctivae are normal. PERRL. Head: Atraumatic. Nose: No congestion/rhinnorhea. Mouth/Throat: Mucous membranes are moist.  Neck: No stridor.   Cardiovascular: Normal rate, regular rhythm. Good peripheral circulation. Grossly normal heart sounds.   Respiratory: Normal respiratory effort.  No retractions. Lungs CTAB. Gastrointestinal: Soft and nontender. No distention.  Musculoskeletal: No lower extremity tenderness nor edema. No gross deformities of extremities. Neurologic: Positive dysarthria. Left hemiparesis (baseline). Mild left face weakness without numbness. No tongue deviation.  Skin:  Skin is warm, dry and intact. No rash noted.  ____________________________________________   LABS (all labs ordered are listed, but only abnormal results are displayed)  Labs Reviewed  CBC - Abnormal; Notable for the following components:      Result Value   RBC 4.15 (*)    Hemoglobin 10.5 (*)    HCT 35.9 (*)    MCH 25.3 (*)    MCHC 29.2 (*)    RDW 22.9 (*)    Platelets 478 (*)    All other components within normal limits  DIFFERENTIAL - Abnormal;  Notable for the following components:   Neutro Abs 7.9 (*)    Eosinophils Absolute 0.7 (*)    All other components within normal limits  COMPREHENSIVE METABOLIC PANEL - Abnormal; Notable for the following components:   CO2 20 (*)    All other components within normal limits  ETHANOL  PROTIME-INR  APTT  I-STAT TROPONIN, ED   ____________________________________________  EKG  Sinus rhythm. Prolonged PR. Normal axis. Narrow ST segments. Normal T waves. No ST elevation or  depression. No STEMI.  ____________________________________________  RADIOLOGY  Ct Head Wo Contrast  Result Date: 09/05/2018 CLINICAL DATA:  Initial evaluation for acute left-sided facial droop. EXAM: CT HEAD WITHOUT CONTRAST TECHNIQUE: Contiguous axial images were obtained from the base of the skull through the vertex without intravenous contrast. COMPARISON:  Prior CT from 03/14/2015. FINDINGS: Brain: Advanced cerebral atrophy with chronic small vessel ischemic disease. Scattered areas of cortical encephalomalacia within the right frontal and parietal lobes consistent with remote MCA territory infarcts. Encephalomalacia at the anterior inferior left frontal lobe likely related to remote trauma. Remote right basal ganglia and left pontine lacunar infarcts. No acute intracranial hemorrhage. No acute large vessel territory infarct. No mass lesion, midline shift or mass effect. Diffuse ventricular prominence related global parenchymal volume loss of hydrocephalus. No extra-axial fluid collection. Vascular: No hyperdense vessel. Calcified atherosclerosis at the skull base. Skull: Scalp soft tissues and calvarium within normal limits. Sinuses/Orbits: Globes and orbital soft tissues within normal limits. Scattered mucosal thickening within the frontoethmoidal recesses bilaterally. Chronic bilateral nasal bone fractures noted. Paranasal sinuses otherwise clear. No mastoid effusion. Other: None. IMPRESSION: 1. No acute intracranial abnormality. 2. Advanced cerebral atrophy with chronic small vessel ischemic disease with multiple remote infarcts as above, stable. Electronically Signed   By: Rise Mu M.D.   On: 09/05/2018 18:38   Mr Brain Wo Contrast  Result Date: 09/05/2018 CLINICAL DATA:  Left-sided facial droop EXAM: MRI HEAD WITHOUT CONTRAST TECHNIQUE: Multiplanar, multiecho pulse sequences of the brain and surrounding structures were obtained without intravenous contrast. COMPARISON:  None.  FINDINGS: BRAIN: There is no acute infarct, acute hemorrhage, hydrocephalus or extra-axial collection. The midline structures are normal. No midline shift or other mass effect. There are multiple old infarcts, including the left frontal lobe, right parietal lobe and multiple sites of the deep gray nuclei. Diffuse confluent hyperintense T2-weighted signal within the periventricular, deep and juxtacortical white matter, most commonly due to chronic ischemic microangiopathy. Generalized atrophy without lobar predilection. There is hemosiderin deposition at the site of the old right parietal infarct. VASCULAR: Major intracranial arterial and venous sinus flow voids are normal. SKULL AND UPPER CERVICAL SPINE: Calvarial bone marrow signal is normal. There is no skull base mass. Visualized upper cervical spine and soft tissues are normal. SINUSES/ORBITS: There is mucosal thickening of the right frontal and right anterior ethmoid sinuses. The orbits are normal. IMPRESSION: 1. No acute intracranial abnormality. 2. Multiple old infarcts, severe atrophy and chronic microvascular ischemia. Electronically Signed   By: Deatra Robinson M.D.   On: 09/05/2018 22:57   Dg Chest Portable 1 View  Result Date: 09/05/2018 CLINICAL DATA:  MRI clearance EXAM: PORTABLE CHEST 1 VIEW COMPARISON:  None. FINDINGS: The heart size and mediastinal contours are within normal limits. Both lungs are clear. The visualized skeletal structures are unremarkable. IMPRESSION: Clear lungs.  No metallic object. Electronically Signed   By: Deatra Robinson M.D.   On: 09/05/2018 22:27    ____________________________________________   PROCEDURES  Procedure(s) performed:   Procedures  None ____________________________________________   INITIAL IMPRESSION / ASSESSMENT AND PLAN / ED COURSE  Pertinent labs & imaging results that were available during my care of the patient were reviewed by me and considered in my medical decision making (see chart  for details).  Patient with significant baseline neuro deficits presents to the ED with questionable new face weakness. No significant changes on my exam. CT imaging and labs are negative. MR brain with no new infarcts. Patient optimized medically to this point. Plan for discharge back to SNF. Discussed results and return precautions with patient and provided information in writing.    ____________________________________________  FINAL CLINICAL IMPRESSION(S) / ED DIAGNOSES  Final diagnoses:  Stroke-like symptoms   Note:  This document was prepared using Dragon voice recognition software and may include unintentional dictation errors.  Corey Caulfield, MD [facility]    Cherlynn Popiel, Arlyss RepressJoshua G, MD 09/06/18 [phone]

## 2018-09-05 NOTE — Discharge Instructions (Signed)
You were seen in the ED today with stroke like symptoms. Your MRI did not show any new stroke. Call your PCP and follow up with the Neurologist as directed.

## 2018-09-05 NOTE — ED Notes (Signed)
Patient transported to CT 

## 2018-09-05 NOTE — ED Notes (Signed)
Please call daughter with an update:  Townsend RogerFanta Hunter @ [phone]

## 2018-09-05 NOTE — ED Notes (Signed)
Patient transported to MRI 

## 2018-09-05 NOTE — ED Notes (Signed)
Pt still MRI.

## 2018-09-05 NOTE — ED Triage Notes (Signed)
Pt here via [facility] with left sided facial droop. LKW unknown. Facility staff poor historians. LKW assumed to be sometime last night. Hx of strokes with left sided paralysis.

## 2018-09-06 ENCOUNTER — Emergency Department (HOSPITAL_COMMUNITY)
Admission: EM | Admit: 2018-09-06 | Discharge: 2018-09-06 | Disposition: A | Discharge status: Home / Self Care | Payer: Medicaid Other | Attending: Emergency Medicine | Admitting: Emergency Medicine

## 2018-09-06 ENCOUNTER — Encounter (HOSPITAL_COMMUNITY): Payer: Self-pay | Admitting: Emergency Medicine

## 2018-09-06 DIAGNOSIS — Z79899 Other long term (current) drug therapy: Secondary | ICD-10-CM | POA: Insufficient documentation

## 2018-09-06 DIAGNOSIS — Z7984 Long term (current) use of oral hypoglycemic drugs: Secondary | ICD-10-CM | POA: Insufficient documentation

## 2018-09-06 DIAGNOSIS — Z8673 Personal history of transient ischemic attack (TIA), and cerebral infarction without residual deficits: Secondary | ICD-10-CM | POA: Diagnosis not present

## 2018-09-06 DIAGNOSIS — R4182 Altered mental status, unspecified: Secondary | ICD-10-CM | POA: Diagnosis not present

## 2018-09-06 DIAGNOSIS — I10 Essential (primary) hypertension: Secondary | ICD-10-CM | POA: Diagnosis not present

## 2018-09-06 DIAGNOSIS — E119 Type 2 diabetes mellitus without complications: Secondary | ICD-10-CM | POA: Diagnosis not present

## 2018-09-06 DIAGNOSIS — Z87891 Personal history of nicotine dependence: Secondary | ICD-10-CM | POA: Insufficient documentation

## 2018-09-06 LAB — TROPONIN I: Troponin I: <0.03 ng/mL (ref <0.03)

## 2018-09-06 LAB — BASIC METABOLIC PANEL
Anion gap: 10 (ref 5–15)
BUN: 10 mg/dL (ref 8–23)
CO2: 21 mmol/L — ABNORMAL LOW (ref 22–32)
Calcium: 9.3 mg/dL (ref 8.9–10.3)
Chloride: 109 mmol/L (ref 98–111)
Creatinine, Ser: 0.73 mg/dL (ref 0.61–1.24)
GFR calc Af Amer: >60 mL/min (ref >60)
GFR calc non Af Amer: >60 mL/min (ref >60)
Glucose, Bld: 111 mg/dL — ABNORMAL HIGH (ref 70–99)
Potassium: 3.9 mmol/L (ref 3.5–5.1)
Sodium: 140 mmol/L (ref 135–145)

## 2018-09-06 LAB — URINALYSIS, ROUTINE W REFLEX MICROSCOPIC
Bacteria, UA: NONE SEEN
Glucose, UA: NEGATIVE mg/dL
Ketones, ur: 80 mg/dL — AB
Leukocytes, UA: NEGATIVE
Nitrite: NEGATIVE
Protein, ur: 30 mg/dL — AB
Specific Gravity, Urine: 1.029 (ref 1.005–1.030)
pH: 5 (ref 5.0–8.0)

## 2018-09-06 LAB — CBG MONITORING, ED: GLUCOSE-CAPILLARY: 107 mg/dL — AB (ref 70–99)

## 2018-09-06 NOTE — ED Triage Notes (Signed)
Pt arrives via EMS for possible unresponsive episode, [facility] 0500 [facility] and was normal at AM med pass but when staff checked on him at 12:30 today he was unresponsive. Pt now alert to baseline. SNF staff unable to provide LSW time.

## 2018-09-06 NOTE — Discharge Instructions (Addendum)
I think Mr Tony Johnson may have had a syncopal event versus less likely a seizure. Prior testing from yesterday was reviewed and some additional testing was done today. It has been fairly reassuring. He hasn't had any more repeat events while here in the ER and he is denying any complaints. Currently, I think he is safe for discharge. I would not start him on medication for possible seizure at this point.

## 2018-09-06 NOTE — ED Notes (Signed)
[facility] CALLED  °

## 2018-09-06 NOTE — ED Notes (Signed)
Patient verbalizes understanding of discharge instructions. Opportunity for questioning and answers were provided. Armband removed by staff, pt discharged from ED with [facility].  

## 2018-09-06 NOTE — ED Notes (Signed)
[facility] called for transport.  

## 2018-09-06 NOTE — ED Provider Notes (Signed)
[facility]   CSN: [id number] Arrival date & time: 09/06/18  1416     History   Chief Complaint Chief Complaint  Patient presents with  . Altered Mental Status    HPI Tony Johnson is a 72 y.o. male.  HPI   71yM sent for evaluation of "Sudden onset of unresposiveness, eyes rolled in back of head, sweating, pupils dilated, non verbal, vss, fsbs 144." Not clear how long this episode lasted or nor what setting it occurred in. No apparent seizure history. Incontinent at baseline. Pt tell me he feels fine. He doesn't remember this event. He was seen in the ED yesterday for reported new onset facial droop. He had work-up including CT/MRI which did not reveal any acute pathology. He left the ER very early this morning. He was reportedly at his baseline when checked this morning by staff. Found shortly after 12p like this.   Past Medical History:  Diagnosis Date  . Adjustment disorder   . CVA (cerebral infarction)    hx of  . Diabetes mellitus   . Disorientation   . Dysphagia   . Hemiparesis (HCC)   . Hemiplegia affecting left nondominant side (HCC)   . Hypertension   . Renal disorder   . Stroke [facility](HCC)     Patient Active Problem List   Diagnosis Date Noted  . Sepsis (HCC) 07/29/2014  . Acute encephalopathy 07/29/2014  . Diarrhea 06/08/2014  . Hypernatremia 06/06/2014  . Metabolic acidosis 06/06/2014  . Acute renal failure (HCC) 06/05/2014  . Dysphagia, pharyngoesophageal phase 05/10/2014  . Acute CVA (cerebrovascular accident) (HCC) 05/10/2014  . Aphasia 05/07/2014  . UTI (lower urinary tract infection) 05/07/2014  . CIGARETTE SMOKER 09/22/2010  . DIVERTICULOSIS OF COLON 03/24/2010  . Essential hypertension 09/11/2009  . DIABETES MELLITUS, TYPE II 09/11/2009  . 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 HYPERCHOLESTEROLEMIA 12/09/2006  . ERECTILE DYSFUNCTION 12/09/2006    Past  Surgical History:  Procedure Laterality Date  . GANGLION CYST EXCISION     s/p removal - wrist in 1965- 07/12/04        Home Medications    Prior to Admission medications   Medication Sig Start Date End Date Taking? Authorizing Provider  acetaminophen (TYLENOL) 500 MG tablet Take 500 mg by mouth every 4 (four) hours as needed for mild pain or fever.    [provider]  cefUROXime (CEFTIN) 500 MG tablet Take 1 tablet (500 mg total) by mouth 2 (two) times daily with a meal. 3 more days Patient not taking: Reported on 03/14/2015 08/01/14   Leroy SeaSingh, Prashant K, MD  cetirizine (ZYRTEC) 10 MG tablet Take 10 mg by mouth daily.    [provider]  cholecalciferol (VITAMIN D) 1000 UNITS tablet Take 2,000 Units by mouth daily.     [provider]  cloNIDine (CATAPRES) 0.3 MG tablet Take 0.3 mg by mouth every 12 (twelve) hours as needed (essential hypertension.).     [provider]  dipyridamole-aspirin (AGGRENOX) 200-25 MG per 12 hr capsule Take 1 capsule by mouth 2 (two) times daily. 05/11/14   Edsel PetrinMikhail, Maryann, DO  ferrous sulfate 325 (65 FE) MG tablet Take 325 mg by mouth 2 (two) times daily.    [provider]  glimepiride (AMARYL) 1 MG tablet Take 1 mg by mouth every morning.    [provider]  loperamide (IMODIUM) 2 MG capsule Take 2 mg by mouth as needed for diarrhea or  loose stools.    [provider]  metFORMIN (GLUCOPHAGE) 1000 MG tablet Take 1,000 mg by mouth 2 (two) times daily with a meal.    [provider]  Multiple Vitamin (MULTIVITAMIN WITH MINERALS) TABS tablet Take 1 tablet by mouth daily.    [provider]  polyethylene glycol (MIRALAX / GLYCOLAX) packet Take 17 g by mouth daily as needed for moderate constipation.     [provider]  pravastatin (PRAVACHOL) 40 MG tablet Take 40 mg by mouth every morning.     [provider]  senna (SENOKOT) 8.6 MG TABS tablet Take 1 tablet by mouth  daily as needed for mild constipation.    [provider]    Family History Family History  Problem Relation Age of Onset  . Cancer Mother        breast cancer  . Diabetes Father     Social History Social History   Tobacco Use  . Smoking status: Former Games developer  . Smokeless tobacco: Never Used  Substance Use Topics  . Alcohol use: No  . Drug use: Not on file     Allergies   Patient has no known allergies.   Review of Systems Review of Systems  All systems reviewed and negative, other than as noted in HPI.  Physical Exam Updated Vital Signs BP 139/74 (BP Location: Left Arm)   Pulse 93   Temp 98.9 F (37.2 C) (Oral)   Resp (!) 36   SpO2 97%   Physical Exam  Constitutional: He appears well-developed and well-nourished. No distress.  HENT:  Head: Normocephalic and atraumatic.  Eyes: Conjunctivae are normal. Right eye exhibits no discharge. Left eye exhibits no discharge.  Neck: Neck supple.  Cardiovascular: Normal rate, regular rhythm and normal heart sounds. Exam reveals no gallop and no friction rub.  No murmur heard. Pulmonary/Chest: Effort normal and breath sounds normal. No respiratory distress.  Abdominal: Soft. He exhibits no distension. There is no tenderness.  Musculoskeletal: He exhibits no edema or tenderness.  Neurological: He is alert.  Awake. Dysarthria. L facial droop. L hemiparesis.   Skin: Skin is warm and dry.  Psychiatric: His behavior is normal.  Nursing note and vitals reviewed.    ED Treatments / Results  Labs (all labs ordered are listed, but only abnormal results are displayed) Labs Reviewed  BASIC METABOLIC PANEL - Abnormal; Notable for the following components:      Result Value   CO2 21 (*)    Glucose, Bld 111 (*)    All other components within normal limits  URINALYSIS, ROUTINE W REFLEX MICROSCOPIC - Abnormal; Notable for the following components:   Color, Urine AMBER (*)    APPearance HAZY (*)    Hgb urine  dipstick SMALL (*)    Bilirubin Urine SMALL (*)    Ketones, ur 80 (*)    Protein, ur 30 (*)    All other components within normal limits  CBG MONITORING, ED - Abnormal; Notable for the following components:   Glucose-Capillary 107 (*)    All other components within normal limits  TROPONIN I    EKG EKG Interpretation  Date/Time:  Tuesday September 06 2018 14:23:26 EST Ventricular Rate:  93 PR Interval:    QRS Duration: 157 QT Interval:  405 QTC Calculation: 504 R Axis:   -86 Text Interpretation:  Sinus or ectopic atrial rhythm 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 EXAM: CT HEAD WITHOUT CONTRAST TECHNIQUE: Contiguous axial images were obtained from the base of the skull through the vertex without intravenous contrast. COMPARISON:  Prior CT from 03/14/2015. FINDINGS: Brain: Advanced cerebral atrophy with chronic small vessel ischemic disease. Scattered areas of cortical encephalomalacia within the right frontal and parietal lobes consistent with remote MCA territory infarcts. Encephalomalacia at the anterior inferior left frontal lobe likely related to remote trauma. Remote right basal ganglia and left pontine lacunar infarcts. No acute intracranial hemorrhage. No acute large vessel territory infarct. No mass lesion, midline shift or mass effect. Diffuse ventricular prominence related global parenchymal volume loss of hydrocephalus. No extra-axial fluid collection. Vascular: No hyperdense vessel. Calcified atherosclerosis at the skull base. Skull: Scalp soft tissues and calvarium within normal limits. Sinuses/Orbits: Globes and orbital soft tissues within normal limits. Scattered mucosal thickening within the frontoethmoidal recesses bilaterally. Chronic bilateral nasal bone fractures noted.  Paranasal sinuses otherwise clear. No mastoid effusion. Other: None. IMPRESSION: 1. No acute intracranial abnormality. 2. Advanced cerebral atrophy with chronic small vessel ischemic disease with multiple remote infarcts as above, stable. Electronically Signed   By: Rise Mu M.D.   On: 09/05/2018 18:38   Mr Brain Wo Contrast  Result Date: 09/05/2018 CLINICAL DATA:  Left-sided facial droop EXAM: MRI HEAD WITHOUT CONTRAST TECHNIQUE: Multiplanar, multiecho pulse sequences of the brain and surrounding structures were obtained without intravenous contrast. COMPARISON:  None. FINDINGS: BRAIN: There is no acute infarct, acute hemorrhage, hydrocephalus or extra-axial collection. The midline structures are normal. No midline shift or other mass effect. There are multiple old infarcts, including the left frontal lobe, right parietal lobe and multiple sites of the deep gray nuclei. Diffuse confluent hyperintense T2-weighted signal within the periventricular, deep and juxtacortical white matter, most commonly due to chronic ischemic microangiopathy. Generalized atrophy without lobar predilection. There is hemosiderin deposition at the site of the old right parietal infarct. VASCULAR: Major intracranial arterial and venous sinus flow voids are normal. SKULL AND UPPER CERVICAL SPINE: Calvarial bone marrow signal is normal. There is no skull base mass. Visualized upper cervical spine and soft tissues are normal. SINUSES/ORBITS: There is mucosal thickening of the right frontal and right anterior ethmoid sinuses. The orbits are normal. IMPRESSION: 1. No acute intracranial abnormality. 2. Multiple old infarcts, severe atrophy and chronic microvascular ischemia. Electronically Signed   By: Deatra Robinson M.D.   On: 09/05/2018 22:57   Dg Chest Portable 1 View  Result Date: 09/05/2018 CLINICAL DATA:  MRI clearance EXAM: PORTABLE CHEST 1 VIEW COMPARISON:  None. FINDINGS: The heart size and mediastinal contours are  within normal limits. Both lungs are clear. The visualized skeletal structures are unremarkable. IMPRESSION: Clear lungs.  No metallic object. Electronically Signed   By: Deatra Robinson M.D.   On: 09/05/2018 22:27    Procedures Procedures (including critical care time)  Medications Ordered in ED Medications - No data to display   Initial Impression / Assessment and Plan / ED Course  I have reviewed the triage vital signs and the nursing notes.  Pertinent labs & imaging results that were available during my care of the patient were reviewed by me and considered in my medical decision making (see chart for details).    72 year old male with change in mental status.  Description of the event symptoms and possible syncope and syncopal "avulsions.  Seizure felt to be less likely.  He now denies  any acute complaints.  Laceration testing here is still reassuring.  He would like to go home.  I doubt emergent process.  Final Clinical Impressions(s) / ED Diagnoses   Final diagnoses:  Acute alteration in mental status    ED Discharge Orders    None       Raeford Razor, MD 09/17/18 1706

## 2020-09-18 IMAGING — DX DG CHEST 1V PORT
1 series · 1 of 1 positions shown · non-contrast
Comparison: None.

CLINICAL DATA: MRI clearance

EXAM:
PORTABLE CHEST 1 VIEW

[chest]
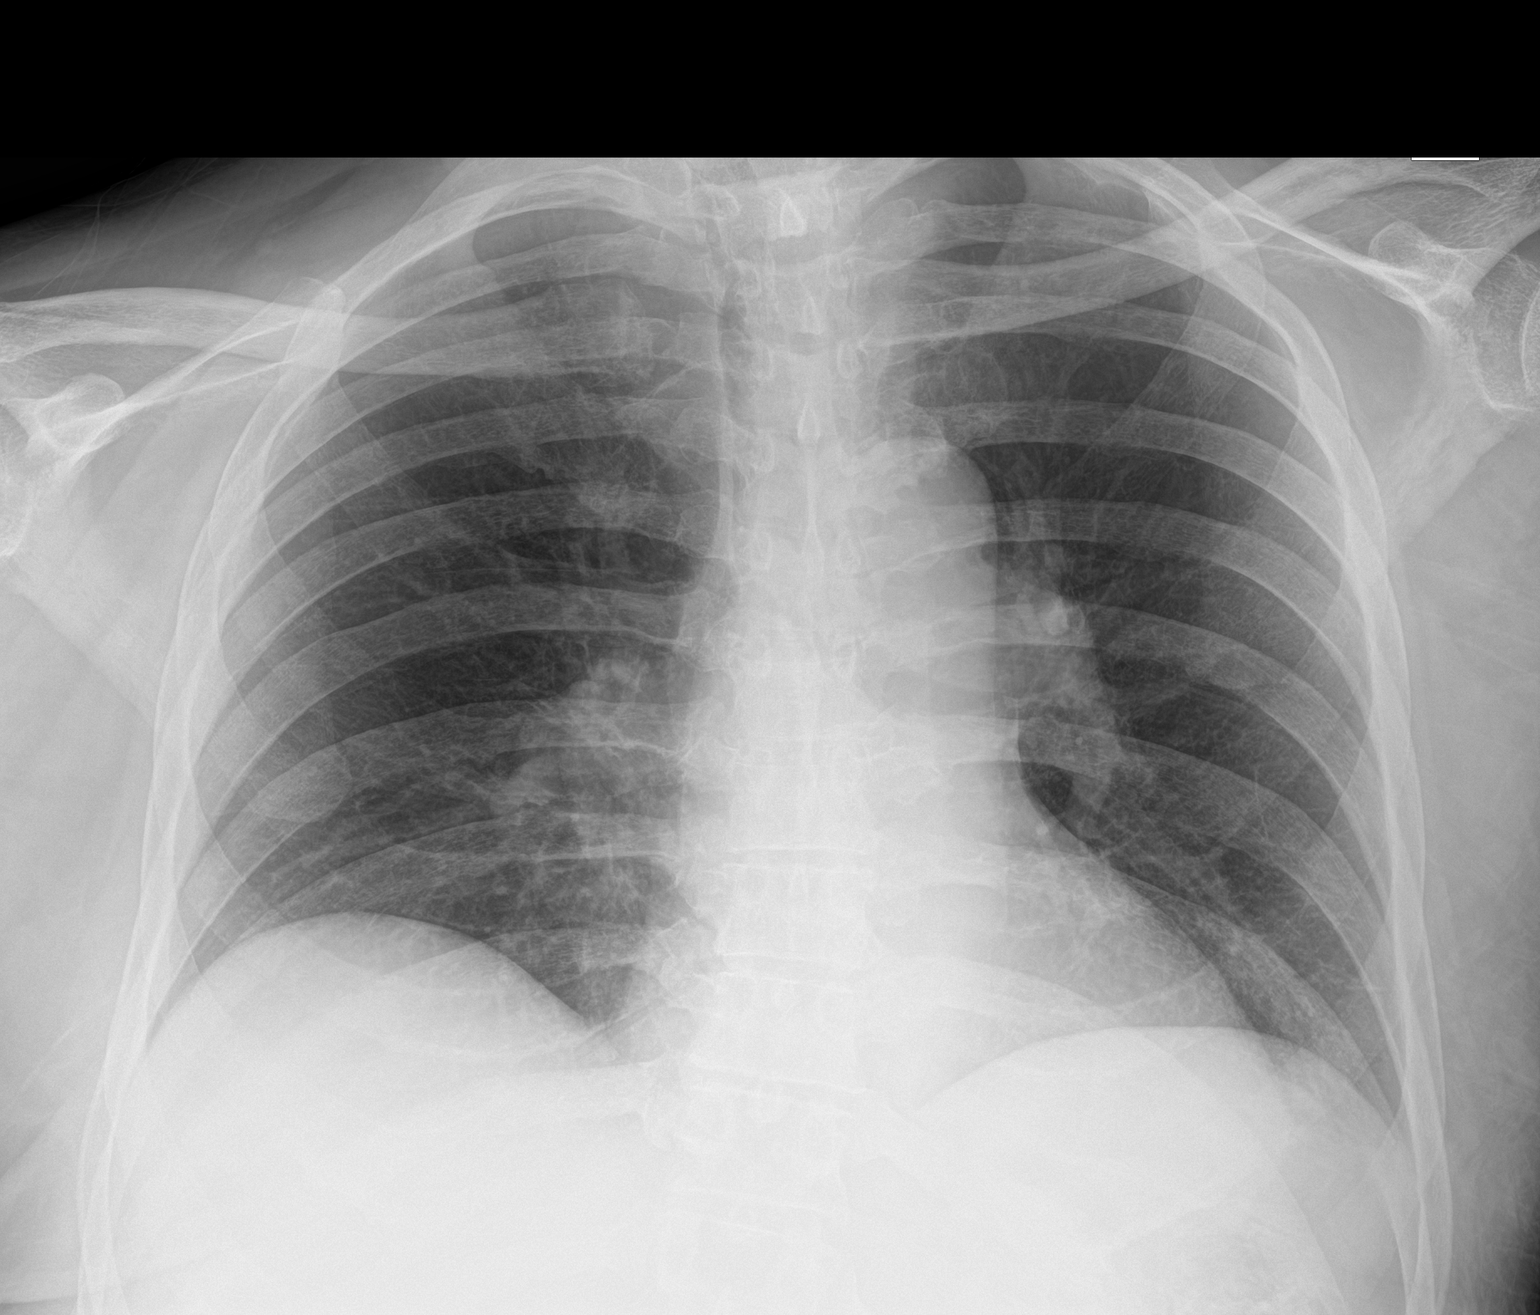

[1 of 1 positions shown; findings below may reference images not displayed]

FINDINGS: The heart size and mediastinal contours are within normal limits.
Both lungs are clear. The visualized skeletal structures are
unremarkable.
IMPRESSION: Clear lungs.  No metallic object.
